# Patient Record
Sex: Female | Born: 1995 | Race: Black or African American | Hispanic: No | Marital: Single | Smoking: Current every day smoker
Health system: Southern US, Community
[De-identification: ages and names within clinical notes are randomized; demographics above are authoritative.]

## PROBLEM LIST (undated history)

## (undated) DIAGNOSIS — N83202 Unspecified ovarian cyst, left side: Secondary | ICD-10-CM

## (undated) DIAGNOSIS — J45909 Unspecified asthma, uncomplicated: Secondary | ICD-10-CM

## (undated) DIAGNOSIS — R55 Syncope and collapse: Secondary | ICD-10-CM

## (undated) DIAGNOSIS — F410 Panic disorder [episodic paroxysmal anxiety] without agoraphobia: Secondary | ICD-10-CM

## (undated) DIAGNOSIS — N83201 Unspecified ovarian cyst, right side: Secondary | ICD-10-CM

## (undated) DIAGNOSIS — F419 Anxiety disorder, unspecified: Secondary | ICD-10-CM

---

## 2013-09-06 ENCOUNTER — Emergency Department (HOSPITAL_COMMUNITY)
Admission: EM | Admit: 2013-09-06 | Discharge: 2013-09-06 | Disposition: A | Discharge status: Home / Self Care | Payer: Self-pay | Attending: Emergency Medicine | Admitting: Emergency Medicine

## 2013-09-06 ENCOUNTER — Emergency Department (HOSPITAL_COMMUNITY): Payer: Self-pay

## 2013-09-06 ENCOUNTER — Encounter (HOSPITAL_COMMUNITY): Payer: Self-pay | Admitting: Emergency Medicine

## 2013-09-06 DIAGNOSIS — R1084 Generalized abdominal pain: Secondary | ICD-10-CM | POA: Insufficient documentation

## 2013-09-06 DIAGNOSIS — J45909 Unspecified asthma, uncomplicated: Secondary | ICD-10-CM | POA: Insufficient documentation

## 2013-09-06 DIAGNOSIS — R109 Unspecified abdominal pain: Secondary | ICD-10-CM

## 2013-09-06 DIAGNOSIS — F17200 Nicotine dependence, unspecified, uncomplicated: Secondary | ICD-10-CM | POA: Insufficient documentation

## 2013-09-06 DIAGNOSIS — N831 Corpus luteum cyst of ovary, unspecified side: Secondary | ICD-10-CM | POA: Insufficient documentation

## 2013-09-06 DIAGNOSIS — Z3202 Encounter for pregnancy test, result negative: Secondary | ICD-10-CM | POA: Insufficient documentation

## 2013-09-06 DIAGNOSIS — R1110 Vomiting, unspecified: Secondary | ICD-10-CM | POA: Insufficient documentation

## 2013-09-06 HISTORY — DX: Unspecified asthma, uncomplicated: J45.909

## 2013-09-06 LAB — CBC WITH DIFFERENTIAL/PLATELET
Basophils Absolute: 0 10*3/uL (ref 0.0–0.1)
HCT: 37.7 % (ref 36.0–46.0)
Lymphocytes Relative: 45 % (ref 12–46)
Neutro Abs: 2.2 10*3/uL (ref 1.7–7.7)
Platelets: 249 10*3/uL (ref 150–400)
RBC: 4.74 MIL/uL (ref 3.87–5.11)
RDW: 13.9 % (ref 11.5–15.5)
WBC: 4.8 10*3/uL (ref 4.0–10.5)

## 2013-09-06 LAB — COMPREHENSIVE METABOLIC PANEL
ALT: 8 U/L (ref 0–35)
AST: 17 U/L (ref 0–37)
Alkaline Phosphatase: 87 U/L (ref 39–117)
BUN: 13 mg/dL (ref 6–23)
CO2: 25 mEq/L (ref 19–32)
Chloride: 101 mEq/L (ref 96–112)
GFR calc Af Amer: >90 mL/min (ref >90)
GFR calc non Af Amer: >90 mL/min (ref >90)
Sodium: 138 mEq/L (ref 135–145)
Total Bilirubin: 0.5 mg/dL (ref 0.3–1.2)

## 2013-09-06 LAB — URINALYSIS, ROUTINE W REFLEX MICROSCOPIC
Bilirubin Urine: NEGATIVE
Glucose, UA: NEGATIVE mg/dL
Hgb urine dipstick: NEGATIVE
Ketones, ur: NEGATIVE mg/dL
Protein, ur: NEGATIVE mg/dL
Urobilinogen, UA: 0.2 mg/dL (ref 0.0–1.0)

## 2013-09-06 LAB — LIPASE, BLOOD: Lipase: 28 U/L (ref 11–59)

## 2013-09-06 LAB — PREGNANCY, URINE: Preg Test, Ur: NEGATIVE

## 2013-09-06 MED ORDER — ONDANSETRON HCL 4 MG/2ML IJ SOLN
4.0000 mg | Freq: Once | INTRAMUSCULAR | Status: AC
  Administered 2013-09-06: 4 mg via INTRAVENOUS
  Filled 2013-09-06: qty 2

## 2013-09-06 MED ORDER — SODIUM CHLORIDE 0.9 % IV BOLUS (SEPSIS)
1000.0000 mL | Freq: Once | INTRAVENOUS | Status: AC
  Administered 2013-09-06: 1000 mL via INTRAVENOUS

## 2013-09-06 MED ORDER — MORPHINE SULFATE 4 MG/ML IJ SOLN
4.0000 mg | Freq: Once | INTRAMUSCULAR | Status: AC
  Administered 2013-09-06: 4 mg via INTRAVENOUS
  Filled 2013-09-06: qty 1

## 2013-09-06 MED ORDER — PROMETHAZINE HCL 25 MG PO TABS: 25.0000 mg | ORAL_TABLET | Freq: Three times a day (TID) | ORAL | Status: DC | PRN

## 2013-09-06 MED ORDER — HYDROCODONE-ACETAMINOPHEN 5-325 MG PO TABS: 1.0000 | ORAL_TABLET | Freq: Four times a day (QID) | ORAL | Status: DC | PRN

## 2013-09-06 MED ORDER — IOHEXOL 300 MG/ML  SOLN
50.0000 mL | Freq: Once | INTRAMUSCULAR | Status: AC | PRN
  Administered 2013-09-06: 50 mL via ORAL

## 2013-09-06 MED ORDER — IOHEXOL 300 MG/ML  SOLN
100.0000 mL | Freq: Once | INTRAMUSCULAR | Status: AC | PRN
  Administered 2013-09-06: 100 mL via ORAL

## 2013-09-06 NOTE — ED Provider Notes (Signed)
Medical screening examination/treatment/procedure(s) were performed by non-physician practitioner and as supervising physician I was immediately available for consultation/collaboration.  Kinta Martis L Tayquan Gassman, MD 09/06/13 2326 

## 2013-09-06 NOTE — ED Provider Notes (Signed)
CSN: [id number]     Arrival date & time 09/06/13  1735 History   First MD Initiated Contact with Patient 09/06/13 2013     Chief Complaint  Patient presents with  . Abdominal Pain    x 2 weeks  . Emesis    x 1 week   (Consider location/radiation/quality/duration/timing/severity/associated sxs/prior Treatment) HPI Patient presents emergency department with 2 weeks worth of generalized abdominal pain, and then a one-week history of vomiting.  Patient, states, that these have been intermittent.  Patient denies vaginal bleeding, or vaginal discharge.  She also denies any difficulty urinating.  Patient denies chest pain, weakness, numbness, dizziness, headache, blurred vision, back pain, fever, or syncope.  Patient, states she did not take any medications prior to arrival. Past Medical History  Diagnosis Date  . Asthma    History reviewed. No pertinent past surgical history. No family history on file. History  Substance Use Topics  . Smoking status: Current Every Day Smoker  . Smokeless tobacco: Not on file  . Alcohol Use: Yes   OB History   Grav Para Term Preterm Abortions TAB SAB Ect Mult Living                 Review of Systems All other systems negative except as documented in the HPI. All pertinent positives and negatives as reviewed in the HPI. Allergies  Review of patient's allergies indicates no known allergies.  Home Medications  No current outpatient prescriptions on file. BP 138/80  Pulse 75  Temp(Src) 98.3 F (36.8 C) (Oral)  Resp 16  SpO2 100%  LMP 08/17/2013 Physical Exam  Nursing note and vitals reviewed. Constitutional: She is oriented to person, place, and time. She appears well-developed and well-nourished. No distress.  HENT:  Head: Normocephalic and atraumatic.  Mouth/Throat: Oropharynx is clear and moist.  Eyes: Pupils are equal, round, and reactive to light.  Neck: Normal range of motion. Neck supple.  Cardiovascular: Normal rate, regular rhythm  and normal heart sounds.  Exam reveals no gallop and no friction rub.   No murmur heard. Pulmonary/Chest: Effort normal and breath sounds normal. No respiratory distress.  Abdominal: Soft. Bowel sounds are normal. She exhibits no distension. There is tenderness. There is no rebound and no guarding.    Neurological: She is alert and oriented to person, place, and time. She exhibits normal muscle tone. Coordination normal.  Skin: Skin is warm and dry. No rash noted.    ED Course  Procedures (including critical care time) Labs Review Labs Reviewed  CBC WITH DIFFERENTIAL - Abnormal; Notable for the following:    MCH 25.7 (*)    All other components within normal limits  COMPREHENSIVE METABOLIC PANEL - Abnormal; Notable for the following:    Glucose, Bld 68 (*)    All other components within normal limits  LIPASE, BLOOD  URINALYSIS, ROUTINE W REFLEX MICROSCOPIC  PREGNANCY, URINE   Imaging Review Ct Abdomen Pelvis W Contrast  09/06/2013   CLINICAL DATA:  Diffuse abdominal pain for 2 weeks, now with nausea and vomiting.  EXAM: CT ABDOMEN AND PELVIS WITH CONTRAST  TECHNIQUE: Multidetector CT imaging of the abdomen and pelvis was performed using the standard protocol following bolus administration of intravenous contrast.  CONTRAST:  OMNIPAQUE IOHEXOL 300 MG/ML  SOLN  COMPARISON:  None available for comparison at time of study interpretation.  FINDINGS: Included view of the lung bases are clear. Visualized heart and pericardium are unremarkable.  The liver, spleen, gallbladder, pancreas and adrenal glands  are unremarkable.  The stomach, small and large bowel are normal in course and caliber without inflammatory changes. Contrast has yet to reach the large bowel. Mild amount of retained large bowel stool. Normal appendix. No intraperitoneal free fluid nor free air.  Kidneys are orthotopic, demonstrating symmetric enhancement without nephrolithiasis, hydronephrosis or renal masses. The  unopacified ureters are normal in course and caliber. Delayed imaging through the kidneys demonstrates symmetric prompt excretion to the proximal urinary collecting system. Urinary bladder is partially distended and unremarkable.  Great vessels are normal in course and caliber. No lymphadenopathy by CT size criteria. Crenulated right adnexal 15 mm presumed corpus luteal cyst. At least 14 mm right uterine body low-density presumed leiomyoma, with approximately 12 mm apparent leiomyoma along the anterior uterine wall. The soft tissues and included osseous structures are nonsuspicious.  IMPRESSION: Apparent 15 mm right corpus luteal cyst in addition to fluid density probable leiomyomas which would be better characterized on pelvic sonogram, as clinically indicated.  Normal appendix.  Mild amount of retained large bowel stool.   Electronically Signed   By: Awilda Metro   On: 09/06/2013 22:30   Patient's laboratory testing, and CT scans were reviewed.  The patient will be referred to GYN.  Followup.  The patient has been sitting in the room in no acute distress.  Her vital signs have been normal here in the emergency department the patient denies any further pain after receiving IV pain medication.  Patient's pain is mostly mid upper abdominal pain.  Patient tolerated oral fluids here without difficulty.  Carlyle Dolly, PA-C 09/06/13 2306

## 2013-09-06 NOTE — ED Notes (Signed)
Pt aware of the need for a urine sample. 

## 2013-09-06 NOTE — ED Notes (Signed)
Pt return from CT.

## 2013-09-06 NOTE — ED Notes (Signed)
Pt presents with NAD- stomach pain generalized pain x 2 weeks and vomiting x 1 week denies vaginall discharge and urinary problems

## 2013-10-29 ENCOUNTER — Emergency Department (HOSPITAL_COMMUNITY): Payer: Managed Care, Other (non HMO)

## 2013-10-29 ENCOUNTER — Emergency Department (HOSPITAL_COMMUNITY)
Admission: EM | Admit: 2013-10-29 | Discharge: 2013-10-29 | Disposition: A | Discharge status: Home / Self Care | Payer: Managed Care, Other (non HMO) | Attending: Emergency Medicine | Admitting: Emergency Medicine

## 2013-10-29 ENCOUNTER — Encounter (HOSPITAL_COMMUNITY): Payer: Self-pay | Admitting: Emergency Medicine

## 2013-10-29 DIAGNOSIS — Z3202 Encounter for pregnancy test, result negative: Secondary | ICD-10-CM | POA: Insufficient documentation

## 2013-10-29 DIAGNOSIS — F17200 Nicotine dependence, unspecified, uncomplicated: Secondary | ICD-10-CM | POA: Insufficient documentation

## 2013-10-29 DIAGNOSIS — N739 Female pelvic inflammatory disease, unspecified: Secondary | ICD-10-CM

## 2013-10-29 DIAGNOSIS — R55 Syncope and collapse: Secondary | ICD-10-CM

## 2013-10-29 DIAGNOSIS — J45909 Unspecified asthma, uncomplicated: Secondary | ICD-10-CM | POA: Insufficient documentation

## 2013-10-29 DIAGNOSIS — R51 Headache: Secondary | ICD-10-CM | POA: Insufficient documentation

## 2013-10-29 HISTORY — DX: Unspecified ovarian cyst, left side: N83.202

## 2013-10-29 HISTORY — DX: Unspecified ovarian cyst, right side: N83.201

## 2013-10-29 LAB — COMPREHENSIVE METABOLIC PANEL
ALK PHOS: 75 U/L (ref 39–117)
ALT: 8 U/L (ref 0–35)
AST: 16 U/L (ref 0–37)
Albumin: 4.1 g/dL (ref 3.5–5.2)
BILIRUBIN TOTAL: 0.6 mg/dL (ref 0.3–1.2)
BUN: 10 mg/dL (ref 6–23)
CHLORIDE: 102 meq/L (ref 96–112)
CO2: 24 mEq/L (ref 19–32)
CREATININE: 0.91 mg/dL (ref 0.50–1.10)
Calcium: 9 mg/dL (ref 8.4–10.5)
GLUCOSE: 86 mg/dL (ref 70–99)
POTASSIUM: 4.1 meq/L (ref 3.7–5.3)
Sodium: 135 mEq/L — ABNORMAL LOW (ref 137–147)
Total Protein: 6.9 g/dL (ref 6.0–8.3)

## 2013-10-29 LAB — URINALYSIS, ROUTINE W REFLEX MICROSCOPIC
Bilirubin Urine: NEGATIVE
Glucose, UA: NEGATIVE mg/dL
Hgb urine dipstick: NEGATIVE
KETONES UR: NEGATIVE mg/dL
NITRITE: NEGATIVE
Protein, ur: NEGATIVE mg/dL
Specific Gravity, Urine: 1.009 (ref 1.005–1.030)
Urobilinogen, UA: 0.2 mg/dL (ref 0.0–1.0)
pH: 7 (ref 5.0–8.0)

## 2013-10-29 LAB — URINE MICROSCOPIC-ADD ON

## 2013-10-29 LAB — CBC
HEMATOCRIT: 36.2 % (ref 36.0–46.0)
Hemoglobin: 11.8 g/dL — ABNORMAL LOW (ref 12.0–15.0)
MCH: 25.4 pg — ABNORMAL LOW (ref 26.0–34.0)
MCHC: 32.6 g/dL (ref 30.0–36.0)
MCV: 77.8 fL — AB (ref 78.0–100.0)
Platelets: 239 10*3/uL (ref 150–400)
RBC: 4.65 MIL/uL (ref 3.87–5.11)
RDW: 13.8 % (ref 11.5–15.5)
WBC: 4.2 10*3/uL (ref 4.0–10.5)

## 2013-10-29 LAB — LIPASE, BLOOD: LIPASE: 42 U/L (ref 11–59)

## 2013-10-29 LAB — WET PREP, GENITAL
Clue Cells Wet Prep HPF POC: NONE SEEN
Trich, Wet Prep: NONE SEEN
Yeast Wet Prep HPF POC: NONE SEEN

## 2013-10-29 LAB — PREGNANCY, URINE: PREG TEST UR: NEGATIVE

## 2013-10-29 MED ORDER — LIDOCAINE HCL 1 % IJ SOLN
INTRAMUSCULAR | Status: AC
  Administered 2013-10-29: 20 mL via INTRAMUSCULAR
  Filled 2013-10-29: qty 20

## 2013-10-29 MED ORDER — SODIUM CHLORIDE 0.9 % IV BOLUS (SEPSIS)
1000.0000 mL | Freq: Once | INTRAVENOUS | Status: AC
  Administered 2013-10-29: 1000 mL via INTRAVENOUS

## 2013-10-29 MED ORDER — DOXYCYCLINE HYCLATE 100 MG PO TABS
100.0000 mg | ORAL_TABLET | Freq: Once | ORAL | Status: AC
  Administered 2013-10-29: 100 mg via ORAL
  Filled 2013-10-29: qty 1

## 2013-10-29 MED ORDER — ONDANSETRON HCL 4 MG/2ML IJ SOLN
4.0000 mg | Freq: Once | INTRAMUSCULAR | Status: AC
  Administered 2013-10-29: 4 mg via INTRAVENOUS
  Filled 2013-10-29: qty 2

## 2013-10-29 MED ORDER — DOXYCYCLINE HYCLATE 100 MG PO CAPS: 100.0000 mg | ORAL_CAPSULE | Freq: Two times a day (BID) | ORAL | Status: DC

## 2013-10-29 MED ORDER — CEFTRIAXONE SODIUM 250 MG IJ SOLR
250.0000 mg | Freq: Once | INTRAMUSCULAR | Status: AC
  Administered 2013-10-29: 250 mg via INTRAMUSCULAR
  Filled 2013-10-29: qty 250

## 2013-10-29 NOTE — ED Notes (Signed)
Initial contact - pt arrived to ED via EMS, reports "not eating well, not feeling well, vomiting, i think i might be pregnant".  Pt reports syncopal episode this AM, witnessed by boyfriend who is currently present.  On arrival to ED pt is a+ox4, well appearing.  Skin pwd.  Pt denies cp/sob, dizziness, weakness.  MAEI.  Pt denies nausea at this time, no active vomiting.  MM pink/moist.  Pt changed to hospital gown, placed to cardiac/02 monitor, NAD.  Awaiting EDP eval.

## 2013-10-29 NOTE — ED Notes (Signed)
Pt ret from U/S at this time, resting on stretcher, denies needs/complaints at this time.  NAD.  

## 2013-10-29 NOTE — ED Notes (Signed)
Bed: WA22 Expected date:  Expected time:  Means of arrival:  Comments: EMS-syncope 

## 2013-10-29 NOTE — ED Provider Notes (Signed)
CSN: [id number]     Arrival date & time 10/29/13  1259 History   First MD Initiated Contact with Patient 10/29/13 1317     Chief Complaint  Patient presents with  . Loss of Consciousness  . Emesis   (Consider location/radiation/quality/duration/timing/severity/associated sxs/prior Treatment) HPI Comments: 18 year old female presents with syncope. She states she was walking from one class to another and felt acutely dizzy and nauseous and passed out. She's not following. She's not think she injured herself. The female that is in the room states that he saw her on the ground but did not actually witness her fall. She denies any chest pain, shortness of breath, or acute headache. She states for the past week she has been nauseous and only to eat soups and fruits. She is nauseous when eating greasy foods or other foods. She has not vomited. She's not had any diarrhea. She has been having intermittent abdominal pain over the past week as well. She points to her left lower quadrant as the area of worst pain. She was here over a month ago with pain on the other side and told she had an ovarian cyst seen on CT scan. She denies any vaginal bleeding or discharge. Her last period was 3 weeks ago.   Past Medical History  Diagnosis Date  . Asthma   . Bilateral ovarian cysts    History reviewed. No pertinent past surgical history. No family history on file. History  Substance Use Topics  . Smoking status: Current Every Day Smoker  . Smokeless tobacco: Not on file  . Alcohol Use: 0.6 oz/week    1 Glasses of wine per week   OB History   Grav Para Term Preterm Abortions TAB SAB Ect Mult Living                 Review of Systems  Constitutional: Negative for fever.  Respiratory: Negative for shortness of breath.   Cardiovascular: Negative for chest pain.  Gastrointestinal: Positive for nausea and abdominal pain. Negative for vomiting and diarrhea.  Genitourinary: Negative for dysuria, hematuria,  vaginal bleeding and vaginal discharge.  Musculoskeletal: Negative for back pain.  Neurological: Positive for syncope and headaches (mild headache for past 3 days. Milder than her normal migraines). Negative for weakness and numbness.  All other systems reviewed and are negative.    Allergies  Review of patient's allergies indicates no known allergies.  Home Medications  No current outpatient prescriptions on file. BP 107/89  Pulse 92  Temp(Src) 98.5 F (36.9 C)  Resp 20  SpO2 100%  LMP 10/12/2013 Physical Exam  Nursing note and vitals reviewed. Constitutional: She is oriented to person, place, and time. She appears well-developed and well-nourished. No distress.  HENT:  Head: Normocephalic and atraumatic.  Right Ear: External ear normal.  Left Ear: External ear normal.  Nose: Nose normal.  Eyes: Right eye exhibits no discharge. Left eye exhibits no discharge.  Cardiovascular: Normal rate, regular rhythm and normal heart sounds.   Pulmonary/Chest: Effort normal and breath sounds normal.  Abdominal: Soft. Normal appearance. There is generalized tenderness (worst in lower abdomen).  Genitourinary: Uterus is tender. Cervix exhibits discharge. Right adnexum displays no mass. Left adnexum displays no mass. Vaginal discharge found.  Neurological: She is alert and oriented to person, place, and time.  Skin: Skin is warm and dry.    ED Course  Procedures (including critical care time) Labs Review Labs Reviewed  WET PREP, GENITAL - Abnormal; Notable for the following:  WBC, Wet Prep HPF POC FEW (*)    All other components within normal limits  URINALYSIS, ROUTINE W REFLEX MICROSCOPIC - Abnormal; Notable for the following:    APPearance CLOUDY (*)    Leukocytes, UA TRACE (*)    All other components within normal limits  URINE MICROSCOPIC-ADD ON - Abnormal; Notable for the following:    Squamous Epithelial / LPF FEW (*)    All other components within normal limits  CBC -  Abnormal; Notable for the following:    Hemoglobin 11.8 (*)    MCV 77.8 (*)    MCH 25.4 (*)    All other components within normal limits  COMPREHENSIVE METABOLIC PANEL - Abnormal; Notable for the following:    Sodium 135 (*)    All other components within normal limits  GC/CHLAMYDIA PROBE AMP  PREGNANCY, URINE  LIPASE, BLOOD   Imaging Review Dg Chest 2 View  10/29/2013   CLINICAL DATA:  Syncope  EXAM: CHEST  2 VIEW  COMPARISON:  None.  FINDINGS: Lungs are clear. Heart size and pulmonary vascularity are normal. No adenopathy. There is thoracic dextroscoliosis. And mediastinal contours are within normal limits. Both lungs are clear. The visualized skeletal structures are unremarkable.  IMPRESSION: No edema or consolidation.   Electronically Signed   By: Bretta Bang M.D.   On: 10/29/2013 14:58    EKG Interpretation    Date/Time:  Thursday October 29 2013 14:24:00 EST Ventricular Rate:  68 PR Interval:  110 QRS Duration: 79 QT Interval:  385 QTC Calculation: 409 R Axis:   89 Text Interpretation:  Sinus rhythm Borderline short PR interval Borderline Q waves in lateral leads No old tracing to compare Confirmed by Moneisha Vosler  MD, Miette Molenda ([id number]) on 10/29/2013 2:30:25 PM            MDM   1. Syncope   2. PID (pelvic inflammatory disease)    Patient is well appearing here. EKG shows no acute reason for syncope, no murmur or chest sx. Grossly normal neuro exam. Given her lower abd pain and previous CT findings (large cyst) with vaginal discharge, will get pelvic ultrasound. Will treat presumptively for PID given her pain and nausea. Possibly the cause of her syncope. Given rocephin IM here, will need 2 weeks of doxy. Care transferred with u/s pending, if negative I feel she can be discharged.    Audree Camel, MD 10/29/13 [phone]

## 2013-10-29 NOTE — ED Provider Notes (Signed)
Dr. Criss AlvineGoldston asked me to check her ultrasound results, then discharge if no abnormal findings were discovered.   Dg Chest 2 View  10/29/2013   CLINICAL DATA:  Syncope  EXAM: CHEST  2 VIEW  COMPARISON:  None.  FINDINGS: Lungs are clear. Heart size and pulmonary vascularity are normal. No adenopathy. There is thoracic dextroscoliosis. And mediastinal contours are within normal limits. Both lungs are clear. The visualized skeletal structures are unremarkable.  IMPRESSION: No edema or consolidation.   Electronically Signed   By: Bretta BangWilliam  Woodruff M.D.   On: 10/29/2013 14:58   [place] Pelvis Complete  10/29/2013   CLINICAL DATA:  18 year old female with pelvic pain maximal at the left lower quadrant. Initial encounter. Negative pregnancy test.  EXAM: TRANSABDOMINAL AND TRANSVAGINAL ULTRASOUND OF PELVIS  DOPPLER ULTRASOUND OF OVARIES  TECHNIQUE: Both transabdominal and transvaginal ultrasound examinations of the pelvis were performed. Transabdominal technique was performed for global imaging of the pelvis including uterus, ovaries, adnexal regions, and pelvic cul-de-sac.  It was necessary to proceed with endovaginal exam following the transabdominal exam to visualize the endometrium. Color and duplex Doppler ultrasound was utilized to evaluate blood flow to the ovaries.  COMPARISON:  CT Abdomen and Pelvis 09/06/2013.  FINDINGS: Uterus  Measurements: 9.1 x 3.6 x 4.8 cm. No fibroids or other mass visualized.  Endometrium  Thickness: 10 mm.  No focal abnormality visualized.  Right ovary  Measurements: 2.7 x 2.2 x 2.9 cm. Complex 12 mm area most resembles a collapsing cyst (image 34). No internal vascularity. Otherwise normal small follicles.  Left ovary  Measurements: 2.5 x 1.5 x 1.8 cm. Normal appearance/no adnexal mass.  Pulsed Doppler evaluation of both ovaries demonstrates normal low-resistance arterial and venous waveforms.  Other findings  Small volume pelvic free fluid.  IMPRESSION: Negative, with no evidence of  ovarian torsion. Small volume free fluid and 12 mm complex area in the right ovary most likely are physiologic.   Electronically Signed   By: Augusto GambleLee  Hall M.D.   On: 10/29/2013 15:54      Her ultrasound has returned  17:455- She is calm and comfortable. Vitals normal. Findings and implications of STD discussed with her, including recommendation for partner evaluation/treatment.  Assessment: U/S does not indicate ovarian problems or severe PID/TOA. Based on this she is stable for d/c and OP treatment for PID  Flint MelterElliott L Stellar Gensel, MD 10/29/13 1754

## 2013-10-29 NOTE — ED Notes (Signed)
Pt from home with "i think i might be pregnant", pt reports vomiting, feeling unwell and syncope this AM.  Pt awake, alert, oriented x4.

## 2013-10-29 NOTE — ED Notes (Signed)
Pt to cxr/u/s at this time.

## 2013-10-30 LAB — GC/CHLAMYDIA PROBE AMP
CT PROBE, AMP APTIMA: NEGATIVE
GC Probe RNA: NEGATIVE

## 2013-11-11 ENCOUNTER — Emergency Department (HOSPITAL_COMMUNITY): Payer: Managed Care, Other (non HMO)

## 2013-11-11 ENCOUNTER — Emergency Department (HOSPITAL_COMMUNITY)
Admission: EM | Admit: 2013-11-11 | Discharge: 2013-11-11 | Disposition: A | Discharge status: Home / Self Care | Payer: Managed Care, Other (non HMO) | Attending: Emergency Medicine | Admitting: Emergency Medicine

## 2013-11-11 ENCOUNTER — Encounter (HOSPITAL_COMMUNITY): Payer: Self-pay | Admitting: Emergency Medicine

## 2013-11-11 DIAGNOSIS — J45909 Unspecified asthma, uncomplicated: Secondary | ICD-10-CM | POA: Insufficient documentation

## 2013-11-11 DIAGNOSIS — N390 Urinary tract infection, site not specified: Secondary | ICD-10-CM | POA: Insufficient documentation

## 2013-11-11 DIAGNOSIS — R42 Dizziness and giddiness: Secondary | ICD-10-CM | POA: Insufficient documentation

## 2013-11-11 DIAGNOSIS — I951 Orthostatic hypotension: Secondary | ICD-10-CM | POA: Insufficient documentation

## 2013-11-11 DIAGNOSIS — R0602 Shortness of breath: Secondary | ICD-10-CM | POA: Insufficient documentation

## 2013-11-11 DIAGNOSIS — Z3202 Encounter for pregnancy test, result negative: Secondary | ICD-10-CM | POA: Insufficient documentation

## 2013-11-11 DIAGNOSIS — R112 Nausea with vomiting, unspecified: Secondary | ICD-10-CM | POA: Insufficient documentation

## 2013-11-11 DIAGNOSIS — F17200 Nicotine dependence, unspecified, uncomplicated: Secondary | ICD-10-CM | POA: Insufficient documentation

## 2013-11-11 DIAGNOSIS — Z792 Long term (current) use of antibiotics: Secondary | ICD-10-CM | POA: Insufficient documentation

## 2013-11-11 DIAGNOSIS — R55 Syncope and collapse: Secondary | ICD-10-CM | POA: Insufficient documentation

## 2013-11-11 LAB — POCT I-STAT, CHEM 8
BUN: 7 mg/dL (ref 6–23)
CHLORIDE: 103 meq/L (ref 96–112)
CREATININE: 0.9 mg/dL (ref 0.50–1.10)
Calcium, Ion: 1.2 mmol/L (ref 1.12–1.23)
GLUCOSE: 87 mg/dL (ref 70–99)
HCT: 40 % (ref 36.0–46.0)
Hemoglobin: 13.6 g/dL (ref 12.0–15.0)
POTASSIUM: 3.6 meq/L — AB (ref 3.7–5.3)
Sodium: 141 mEq/L (ref 137–147)
TCO2: 24 mmol/L (ref 0–100)

## 2013-11-11 LAB — CBC WITH DIFFERENTIAL/PLATELET
BASOS ABS: 0 10*3/uL (ref 0.0–0.1)
BASOS PCT: 1 % (ref 0–1)
EOS ABS: 0 10*3/uL (ref 0.0–0.7)
EOS PCT: 1 % (ref 0–5)
HEMATOCRIT: 37.1 % (ref 36.0–46.0)
HEMOGLOBIN: 12.1 g/dL (ref 12.0–15.0)
Lymphocytes Relative: 41 % (ref 12–46)
Lymphs Abs: 1.6 10*3/uL (ref 0.7–4.0)
MCH: 25.7 pg — AB (ref 26.0–34.0)
MCHC: 32.6 g/dL (ref 30.0–36.0)
MCV: 78.9 fL (ref 78.0–100.0)
MONO ABS: 0.4 10*3/uL (ref 0.1–1.0)
MONOS PCT: 9 % (ref 3–12)
Neutro Abs: 1.8 10*3/uL (ref 1.7–7.7)
Neutrophils Relative %: 48 % (ref 43–77)
Platelets: 233 10*3/uL (ref 150–400)
RBC: 4.7 MIL/uL (ref 3.87–5.11)
RDW: 14 % (ref 11.5–15.5)
WBC: 3.8 10*3/uL — ABNORMAL LOW (ref 4.0–10.5)

## 2013-11-11 LAB — BASIC METABOLIC PANEL
BUN: 9 mg/dL (ref 6–23)
CALCIUM: 9.6 mg/dL (ref 8.4–10.5)
CO2: 24 mEq/L (ref 19–32)
CREATININE: 0.86 mg/dL (ref 0.50–1.10)
Chloride: 101 mEq/L (ref 96–112)
Glucose, Bld: 88 mg/dL (ref 70–99)
Potassium: 3.8 mEq/L (ref 3.7–5.3)
Sodium: 138 mEq/L (ref 137–147)

## 2013-11-11 LAB — POCT PREGNANCY, URINE: Preg Test, Ur: NEGATIVE

## 2013-11-11 LAB — URINALYSIS, ROUTINE W REFLEX MICROSCOPIC
Bilirubin Urine: NEGATIVE
Glucose, UA: NEGATIVE mg/dL
Hgb urine dipstick: NEGATIVE
KETONES UR: NEGATIVE mg/dL
NITRITE: NEGATIVE
PROTEIN: NEGATIVE mg/dL
Specific Gravity, Urine: 1.002 — ABNORMAL LOW (ref 1.005–1.030)
Urobilinogen, UA: 0.2 mg/dL (ref 0.0–1.0)
pH: 7.5 (ref 5.0–8.0)

## 2013-11-11 LAB — PREGNANCY, URINE: Preg Test, Ur: NEGATIVE

## 2013-11-11 LAB — URINE MICROSCOPIC-ADD ON

## 2013-11-11 MED ORDER — SODIUM CHLORIDE 0.9 % IV BOLUS (SEPSIS)
1000.0000 mL | Freq: Once | INTRAVENOUS | Status: AC
  Administered 2013-11-11: 1000 mL via INTRAVENOUS

## 2013-11-11 MED ORDER — ONDANSETRON 4 MG PO TBDP: 4.0000 mg | ORAL_TABLET | Freq: Three times a day (TID) | ORAL | Status: DC | PRN

## 2013-11-11 MED ORDER — SULFAMETHOXAZOLE-TRIMETHOPRIM 800-160 MG PO TABS: 1.0000 | ORAL_TABLET | Freq: Two times a day (BID) | ORAL | Status: DC

## 2013-11-11 NOTE — ED Notes (Signed)
Pt had witnessed syncopal episode by friends, stated it lasted a couple of secs. Pt states she has been sick since Dec, n/v, thinks she maybe pregnant. Denies pain at the moment.

## 2013-11-11 NOTE — ED Provider Notes (Signed)
CSN: [id number]     Arrival date & time 11/11/13  1421 History   First MD Initiated Contact with Patient 11/11/13 1512     Chief Complaint  Patient presents with  . Loss of Consciousness     (Consider location/radiation/quality/duration/timing/severity/associated sxs/prior Treatment) HPI Comments: Patient is a 18 year old female with a past medical history of ovarian cysts and asthma who presents after a witnessed syncopal episode that occurred prior to arrival. Patient reports feeling suddenly lightheaded and SOB and then she "passed out." A friend witnessed the episode. Patient was unconscious for a few seconds before becoming conscious again. Patient reports possibly being pregnant. She also reports feeling nauseous and having some vomiting for the past 2 months. Patient reports not drinking that much water lately. No aggravating/alleviating factors. Patient had an episode similar to this about 2 weeks ago and was diagnosed with PID.    Past Medical History  Diagnosis Date  . Asthma   . Bilateral ovarian cysts    History reviewed. No pertinent past surgical history. No family history on file. History  Substance Use Topics  . Smoking status: Current Every Day Smoker  . Smokeless tobacco: Not on file  . Alcohol Use: 0.6 oz/week    1 Glasses of wine per week   OB History   Grav Para Term Preterm Abortions TAB SAB Ect Mult Living                 Review of Systems  Constitutional: Negative for fever, chills and fatigue.  HENT: Negative for trouble swallowing.   Eyes: Negative for visual disturbance.  Respiratory: Negative for shortness of breath.   Cardiovascular: Negative for chest pain and palpitations.  Gastrointestinal: Positive for nausea. Negative for vomiting, abdominal pain and diarrhea.  Genitourinary: Negative for dysuria and difficulty urinating.  Musculoskeletal: Negative for arthralgias and neck pain.  Skin: Negative for color change.  Neurological: Positive for  syncope. Negative for dizziness and weakness.  Psychiatric/Behavioral: Negative for dysphoric mood.      Allergies  Review of patient's allergies indicates no known allergies.  Home Medications   Current Outpatient Rx  Name  Route  Sig  Dispense  Refill  . doxycycline (VIBRAMYCIN) 100 MG capsule   Oral   Take 1 capsule (100 mg total) by mouth 2 (two) times daily. One po bid x 14 days   28 capsule   0    BP 125/72  Pulse 101  Temp(Src) 98.7 F (37.1 C) (Oral)  Resp 16  SpO2 100%  LMP 10/12/2013 Physical Exam  Nursing note and vitals reviewed. Constitutional: She is oriented to person, place, and time. She appears well-developed and well-nourished. No distress.  HENT:  Head: Normocephalic and atraumatic.  Eyes: Conjunctivae and EOM are normal.  Neck: Normal range of motion.  Cardiovascular: Normal rate and regular rhythm.  Exam reveals no gallop and no friction rub.   No murmur heard. Pulmonary/Chest: Effort normal and breath sounds normal. She has no wheezes. She has no rales. She exhibits no tenderness.  Abdominal: Soft. She exhibits no distension. There is no tenderness. There is no rebound.  Musculoskeletal: Normal range of motion.  Neurological: She is alert and oriented to person, place, and time. Coordination normal.  Speech is goal-oriented. Moves limbs without ataxia.   Skin: Skin is warm and dry.  Psychiatric: She has a normal mood and affect. Her behavior is normal.    ED Course  Procedures (including critical care time) Labs Review Labs Reviewed  CBC WITH DIFFERENTIAL - Abnormal; Notable for the following:    WBC 3.8 (*)    MCH 25.7 (*)    All other components within normal limits  URINALYSIS, ROUTINE W REFLEX MICROSCOPIC - Abnormal; Notable for the following:    Specific Gravity, Urine 1.002 (*)    Leukocytes, UA TRACE (*)    All other components within normal limits  POCT I-STAT, CHEM 8 - Abnormal; Notable for the following:    Potassium 3.6 (*)     All other components within normal limits  BASIC METABOLIC PANEL  PREGNANCY, URINE  URINE MICROSCOPIC-ADD ON  POCT PREGNANCY, URINE   Imaging Review Dg Chest 2 View  11/11/2013   CLINICAL DATA:  Shortness of breath and syncope.  EXAM: CHEST - 2 VIEW  COMPARISON:  DG CHEST 2 VIEW dated 10/29/2013  FINDINGS: The heart size and mediastinal contours are within normal limits. There is no evidence of pulmonary edema, consolidation, pneumothorax, nodule or pleural fluid. There is a stable mild rightward convex scoliosis of the mid thoracic spine.  IMPRESSION: No active disease.   Electronically Signed   By: Irish Lack M.D.   On: 11/11/2013 15:55    EKG Interpretation    Date/Time:  Wednesday November 11 2013 14:55:09 EST Ventricular Rate:  69 PR Interval:  109 QRS Duration: 77 QT Interval:  392 QTC Calculation: 420 R Axis:   90 Text Interpretation:  Sinus rhythm Short PR interval Borderline right axis deviation Sinus rhythm No significant change since last tracing with short PR Abnormal ekg Confirmed by Gerhard Munch  MD [phone]) on 11/11/2013 3:14:14 PM            MDM   Final diagnoses:  Syncope due to orthostatic hypotension  UTI (lower urinary tract infection)    4:17 PM Patient is PERC negative. Patient's labs are unremarkable for acute changes. Chest xray unremarkable for acute changes. Urinalysis shows mild infection. Patient's orthostatic vitals signs show decreased in 12 mmhg on diastolic pressure when standing and patient becomes tachycardic at 101 when standing. Patient likely volume depleted from vomiting/not drinking enough water which caused her syncopal episode. Patient given 1 L fluids here.     Emilia Beck, PA-C 11/11/13 1712

## 2013-11-11 NOTE — Discharge Instructions (Signed)
Take bactrim as directed until gone. Take zofran as needed for nausea. Refer to attached documents for more information. Follow up with a primary care provider from the resource guide below.    Emergency Department Resource Guide 1) Find a Doctor and Pay Out of Pocket Although you won't have to find out who is covered by your insurance plan, it is a good idea to ask around and get recommendations. You will then need to call the office and see if the doctor you have chosen will accept you as a new patient and what types of options they offer for patients who are self-pay. Some doctors offer discounts or will set up payment plans for their patients who do not have insurance, but you will need to ask so you aren't surprised when you get to your appointment.  2) Contact Your Local Health Department Not all health departments have doctors that can see patients for sick visits, but many do, so it is worth a call to see if yours does. If you don't know where your local health department is, you can check in your phone book. The CDC also has a tool to help you locate your state's health department, and many state websites also have listings of all of their local health departments.  3) Find a Walk-in Clinic If your illness is not likely to be very severe or complicated, you may want to try a walk in clinic. These are popping up all over the country in pharmacies, drugstores, and shopping centers. They're usually staffed by nurse practitioners or physician assistants that have been trained to treat common illnesses and complaints. They're usually fairly quick and inexpensive. However, if you have serious medical issues or chronic medical problems, these are probably not your best option.  No Primary Care Doctor: - Call Health Connect at  [phone] - they can help you locate a primary care doctor that  accepts your insurance, provides certain services, etc. - Physician Referral Service- [phone]  Chronic  Pain Problems: Organization         Address  Phone   Notes  [facility]  [phone] Patients need to be referred by their primary care doctor.   Medication Assistance: Organization         Address  Phone   Notes  [facility] [address] [place], [place] [address] [phone] --Must be a resident of [facility] -- Must have NO insurance coverage whatsoever (no Medicaid/ Medicare, etc.) -- The pt. MUST have a primary care doctor that directs their care regularly and follows them in the community   Med[facility]ist  [phone]   [employer]  [phone]    Agencies that provide inexpensive medical care: Organization         Address  Phone   Notes  [facility]  [phone]   [facility]    [phone]   [facility] [address] [place], [place] [address] [phone]   [facility] [address], [place] [phone]   [facility]anned Parenthood    [phone]   [facility]    [phone]   [facility]  [address], [place] Phone:  [phone], Fax:  [phone]) [phone] Hours of Operation:  9 am - 6 pm, M-F.  Also accepts Medicaid/Medicare and self-pay.  [facility]  [address], [place] Phone: [phone], Fax: [phone]. Hours of Operation:  8:30 am - 5:30 pm, M-F.  Also accepts Medicaid and self-pay.  °[facility] [place] [address], [place] Phone: [phone]   °[facility] [address], [place], [place] [phone], Ext. [phone] Mondays & Thursdays: 7-9 AM.  First 15 patients are seen on a first come, first serve basis. °  ° °Medicaid-accepting [place] Providers: ° °Organization         Address  Phone   Notes  °[facility] [address], [place] [phone] Also  accepts self-pay patients.  °[facility] [address], [place] ° [phone]   °[facility] [address], [place] [phone]   °[facility] [address] [place] [address], [place] [phone]   °Veita Bland [address], [place]  ° [phone] Only accepts [place] Access Medicaid patients after they have their name applied to their card.  ° °Self-Pay (no insurance) in [place]: ° °Organization         Address  Phone   Notes  °Sickle Cell Patients, [facility] [address], [place] [phone]   °[facility] [address], [place] [phone]   °[facility] [place] ° [address], [place] [phone]   °[facility]/Dr. Osei-Bonsu ° [address] [place] [address], [place] or [address], [place] [phone] Phone number for both [place] and [place] locations is the same.  °[facility] [address], [place] [phone]   °[facility] [address] [place] [address], [place] or [address] [phone] °([phone]) [phone]   °[facility] [address], [place] [phone], phone; [phone], fax Sees patients 1st and 3rd Saturday of every month.  Must not qualify for public or private insurance (i.e. Medicaid, Medicare, [place] Health Choice, Veterans' Benefits) • Household income should be no more than 200% of the poverty level •The clinic cannot treat you if you are pregnant or think you are pregnant • Sexually transmitted diseases are not treated at the clinic.  ° ° °Dental Care: °Organization         Address  Phone  Notes  °[place] [facility] [address], [place] [phone] Accepts children up to age 21 who are enrolled in Medicaid or [place] Health Choice; pregnant  women with a Medicaid card; and children who have applied for Medicaid or [place] Health Choice, but were declined, whose parents can pay a reduced fee at time of service.  °[place] [facility] [place]  [address], [place] [phone]) [phone] Accepts children up to age 21 who are enrolled in Medicaid or [place] Health Choice; pregnant women with a Medicaid card; and children who have applied for Medicaid or  Health Choice, but were declined, whose parents can pay a reduced fee at time of service.  °[facility] ° [address],  [phone] Patients are seen by appointment only. Walk-ins are not accepted. [facility] will see patients 18 years of age and older. °Monday - Tuesday (8am-5pm) °Most Wednesdays (8:30-5pm) °$30 per visit, cash only  °[facility]  [address], [facility] [phone] Patients are seen by appointment only. Walk-ins are not accepted. [place] will see patients 60 years of age and older. One Wednesday Evening (Monthly: Volunteer Based).  $30 per visit, cash only  [place]  [phone] for adults; Children under age 4, call Graduate Pediatric Dentistry at [phone]. Children aged 49-14, please call [phone] to request a pediatric application.  Dental services are provided in all areas of dental care including fillings, crowns and bridges, complete and partial dentures, implants, gum treatment, root canals, and extractions. Preventive care is also provided. Treatment is provided to both adults and children. Patients are selected via a lottery and there is often a waiting list.   [facility] [address] [place]  [phone] [email or web address]   [facility] [address] [place], [place] [phone], Ext. [phone] Second and Fourth Thursday of each month, opens at 6:30 AM; Clinic ends at 9 AM.  Patients are  seen on a first-come first-served basis, and a limited number are seen during each clinic.   [facility]  [address] Hillard Danker [place], [place] [phone]   Eligibility Requirements You must have lived in [place], [place], or [place] counties for at least the last three months.   You cannot be eligible for state or federal sponsored [employer], including [employer], [place], or [employer].   You generally cannot be eligible for healthcare insurance through your employer.    How to apply: Eligibility screenings are held every Tuesday and Wednesday afternoon from 1:00 pm until 4:00 pm. You do not need an appointment for the interview!  [facility] [address] [place], [place]   [place] Department  [place]  [phone]    Behavioral Health Resources in the Community: Intensive Outpatient Programs Organization         Address  Phone  Notes  [facility] [place]. [address], [place], [place] [phone]   [facility] [address], [place], [place]   [facility]: [facility] [address] [address], [place], [place]   [place] [address],  [place], [place] or [phone]   Substance Abuse Resources Organization         Address  Phone  Notes  [facility]  [phone]   [place]  [phone]   [facility] [place]   Chinita Pester  [phone]   Residential & Outpatient Substance Abuse Program  [phone]   Psychological Services Organization         Address  Phone  Notes  [facility] [place]  [place]  [phone]   [place] [address] [place] [phone] or [phone]    Mobile Crisis  Teams Organization         Address  Phone  Notes  [facility], Mobile Crisis Care Unit  [phone]   [facility]  [address] [place], [place]   [facility] [address] [place] [phone]    Self-Help/Support Groups Organization         Address  Phone             Notes  [facility] [place] - variety of support groups  [place] Call for more information  Narcotics Anonymous (NA), [facility] [address], [employer] [place]  2 meetings at this location   Special educational needs teacher         Address  Phone  Notes  [facility] [place],    [place]  [phone]   [facility]  [address], [place] [address], [place], [place]   [place], [place] [phone] Admissions: 8am-3pm M-F  Incentives Substance [place] [address],    [place], [place] [phone]   [facility] [address] [place], [place], [place]   [facility] [address]  [place], [place]   [facility] - Intensive Outpatient [address], [place], [place]   [facility]  [place], [place] [phone] or [phone]   [facility] ([facility]) [address] [place], [place] Accepts Medicaid  [facility] [place] [address],  [place] [phone] Substance Abuse/Addiction Treatment   [facility] Organization         Address  Phone  Notes  [facility]  [phone]   Domenic Schwab, PhD [address] Arlis Porta [place], [place]   [phone] or [phone]   [place], [place] [phone]   [facility] [address], [place], [place] [phone]  Insurance/Medicaid/sponsorship through [facility] [address] [place] [address]                                    [place], [place] [phone] [place] [address], [place] [phone]    Dr. Adele Schilder  [phone]   [facility] Dept. 1) [address] [place] 2) [place] 3)  [address] [address], [place] [phone]  [phone]   [place] [phone] or [phone] (After Hours)

## 2013-11-11 NOTE — ED Provider Notes (Signed)
Medical screening examination/treatment/procedure(s) were performed by non-physician practitioner and as supervising physician I was immediately available for consultation/collaboration.  Flint MelterElliott L Rakan Soffer, MD 11/11/13 2232

## 2013-11-11 NOTE — ED Notes (Signed)
Bed: WLPT1 Expected date:  Expected time:  Means of arrival:  Comments: 18 y/o syncope

## 2014-02-12 IMAGING — CT CT ABD-PELV W/ CM
1 of 2 series · 15 of 32 positions shown, 19 images · IV contrast (omnipaque)
Comparison: None available for comparison at time of study
interpretation.

CLINICAL DATA: Diffuse abdominal pain for 2 weeks, now with nausea
and vomiting.

EXAM:
CT ABDOMEN AND PELVIS WITH CONTRAST
TECHNIQUE: Multidetector CT imaging of the abdomen and pelvis was performed
using the standard protocol following bolus administration of
intravenous contrast.
CONTRAST:  100mL OMNIPAQUE IOHEXOL 300 MG/ML  SOLN

[Series 2: abd/pel with · axial · 0.62mm/px · z∈[+1166,+1542]mm · 15 of 83 slices shown, 19 images]
[im 4/83  soft-tissue]
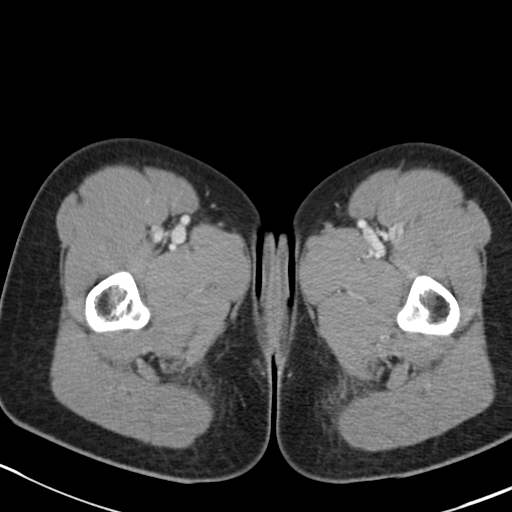
[im 4/83  bone]
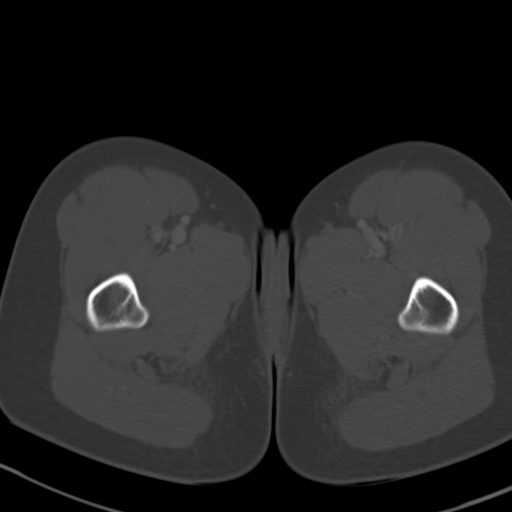
[im 11/83  soft-tissue]
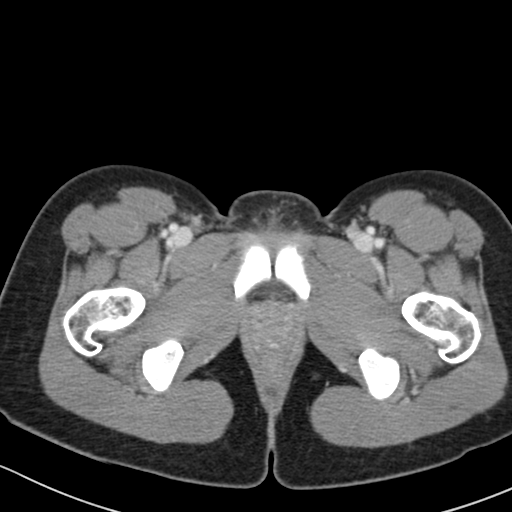
[im 18/83  soft-tissue]
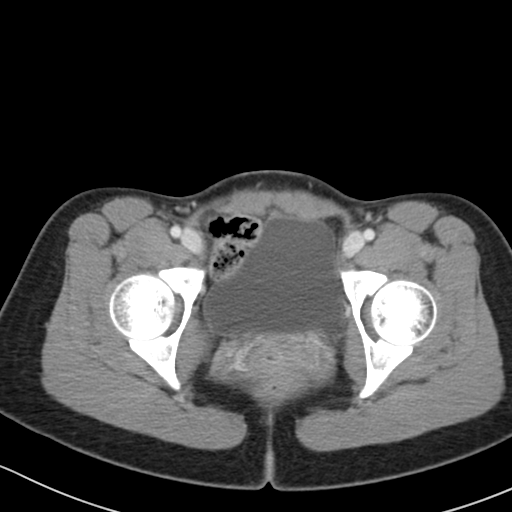
[im 24/83  soft-tissue]
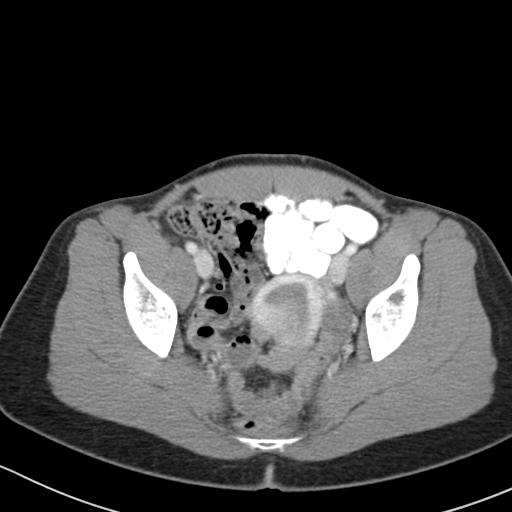
[im 28/83  soft-tissue]
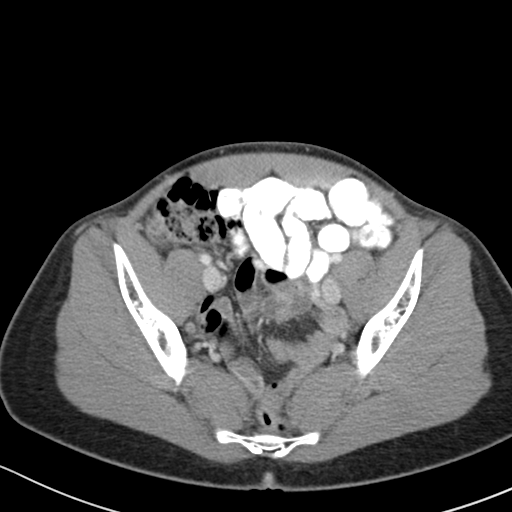
[im 35/83  soft-tissue]
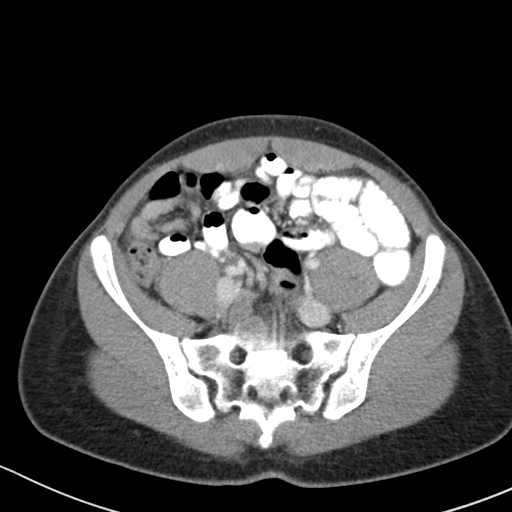
[im 42/83  soft-tissue]
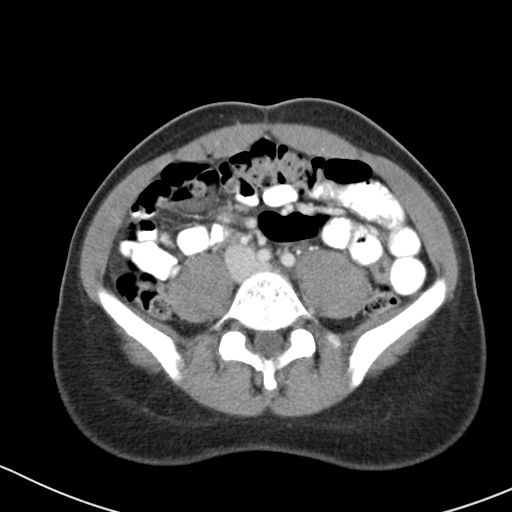
[im 48/83  soft-tissue]
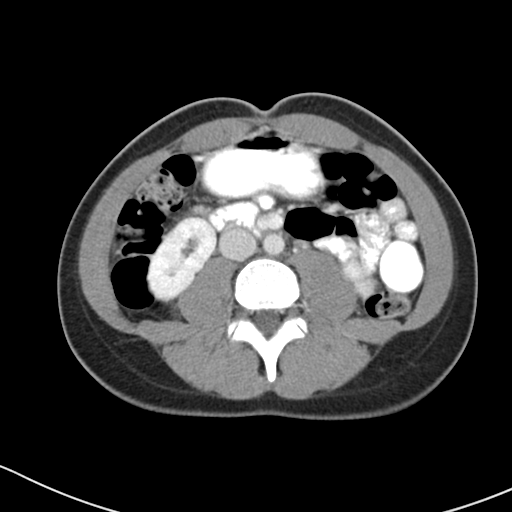
[im 55/83  soft-tissue]
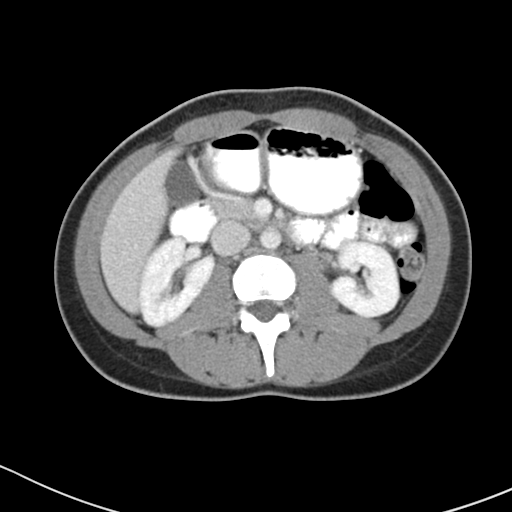
[im 55/83  bone]
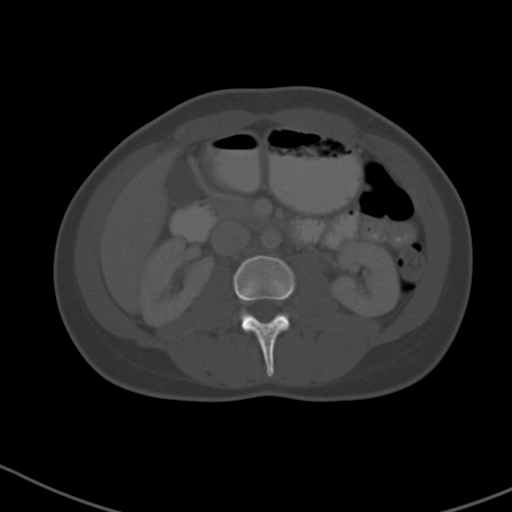
[im 59/83  soft-tissue]
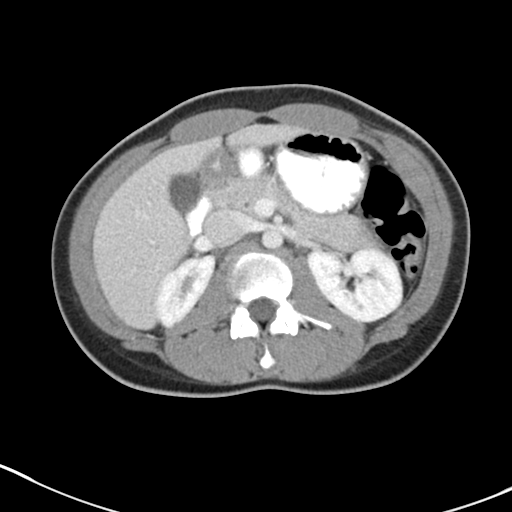
[im 65/83  soft-tissue]
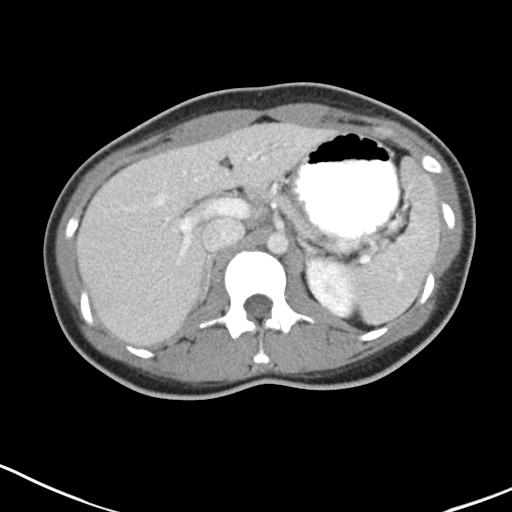
[im 69/83  lung]
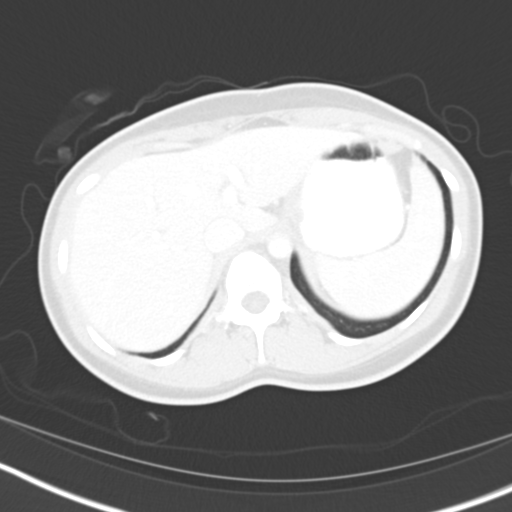
[im 72/83  soft-tissue]
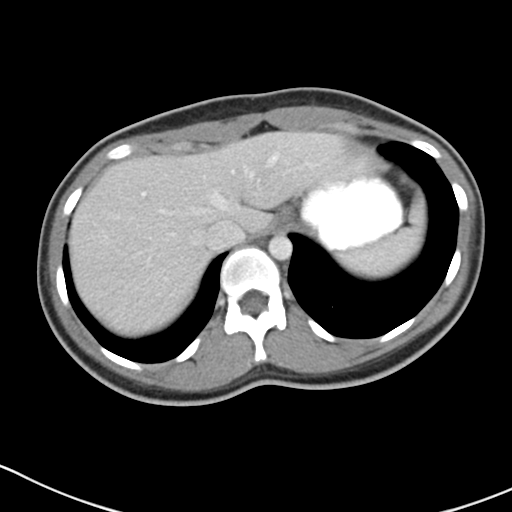
[im 72/83  lung]
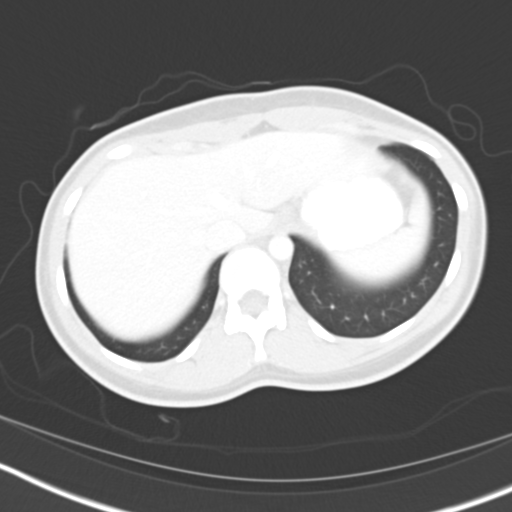
[im 76/83  lung]
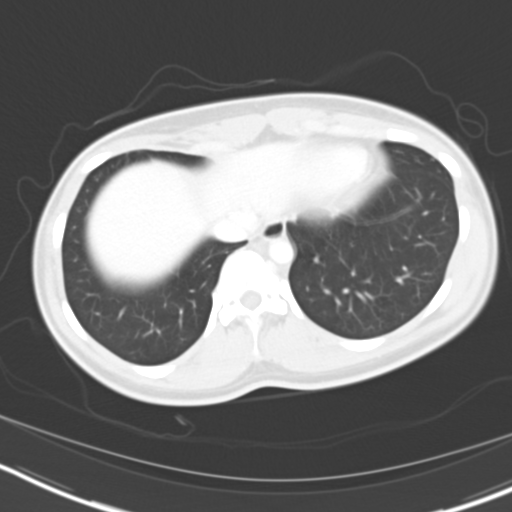
[im 79/83  soft-tissue]
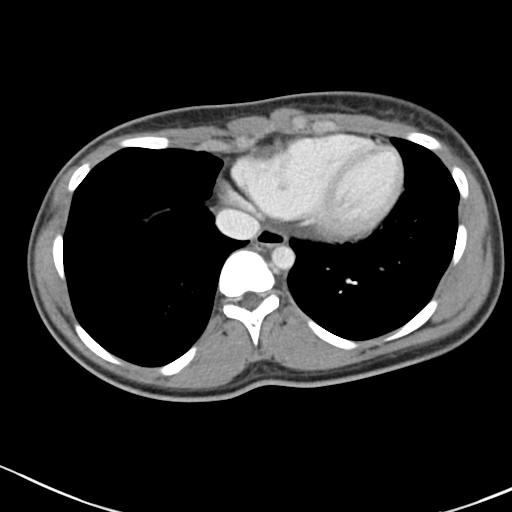
[im 79/83  lung]
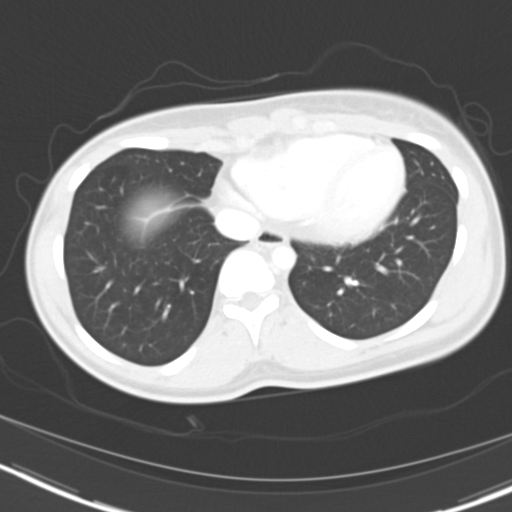

[15 of 32 positions shown; findings below may reference images not displayed]

FINDINGS: Included view of the lung bases are clear. Visualized heart and
pericardium are unremarkable.

The liver, spleen, gallbladder, pancreas and adrenal glands are
unremarkable.

The stomach, small and large bowel are normal in course and caliber
without inflammatory changes. Contrast has yet to reach the large
bowel. Mild amount of retained large bowel stool. Normal appendix.
No intraperitoneal free fluid nor free air.

Kidneys are orthotopic, demonstrating symmetric enhancement without
nephrolithiasis, hydronephrosis or renal masses. The unopacified
ureters are normal in course and caliber. Delayed imaging through
the kidneys demonstrates symmetric prompt excretion to the proximal
urinary collecting system. Urinary bladder is partially distended
and unremarkable.

Great vessels are normal in course and caliber. No lymphadenopathy
by CT size criteria. Crenulated right adnexal 15 mm presumed corpus
luteal cyst. At least 14 mm right uterine body low-density presumed
leiomyoma, with approximately 12 mm apparent leiomyoma along the
anterior uterine wall. The soft tissues and included osseous
structures are nonsuspicious.
IMPRESSION: Apparent 15 mm right corpus luteal cyst in addition to fluid density
probable leiomyomas which would be better characterized on pelvic
sonogram, as clinically indicated.

Normal appendix.  Mild amount of retained large bowel stool.

  By: Tanu Oxendine

## 2014-07-16 ENCOUNTER — Encounter (HOSPITAL_COMMUNITY): Payer: Self-pay | Admitting: Emergency Medicine

## 2014-07-16 ENCOUNTER — Emergency Department (HOSPITAL_COMMUNITY): Payer: Managed Care, Other (non HMO)

## 2014-07-16 ENCOUNTER — Emergency Department (HOSPITAL_COMMUNITY)
Admission: EM | Admit: 2014-07-16 | Discharge: 2014-07-16 | Disposition: A | Discharge status: Home / Self Care | Payer: Managed Care, Other (non HMO) | Attending: Emergency Medicine | Admitting: Emergency Medicine

## 2014-07-16 DIAGNOSIS — Z8742 Personal history of other diseases of the female genital tract: Secondary | ICD-10-CM | POA: Diagnosis not present

## 2014-07-16 DIAGNOSIS — Z79899 Other long term (current) drug therapy: Secondary | ICD-10-CM | POA: Insufficient documentation

## 2014-07-16 DIAGNOSIS — R1031 Right lower quadrant pain: Secondary | ICD-10-CM | POA: Diagnosis not present

## 2014-07-16 DIAGNOSIS — J45909 Unspecified asthma, uncomplicated: Secondary | ICD-10-CM | POA: Insufficient documentation

## 2014-07-16 DIAGNOSIS — Z3202 Encounter for pregnancy test, result negative: Secondary | ICD-10-CM | POA: Insufficient documentation

## 2014-07-16 DIAGNOSIS — K5900 Constipation, unspecified: Secondary | ICD-10-CM | POA: Insufficient documentation

## 2014-07-16 DIAGNOSIS — Z720 Tobacco use: Secondary | ICD-10-CM | POA: Insufficient documentation

## 2014-07-16 DIAGNOSIS — R1032 Left lower quadrant pain: Secondary | ICD-10-CM | POA: Diagnosis not present

## 2014-07-16 DIAGNOSIS — F410 Panic disorder [episodic paroxysmal anxiety] without agoraphobia: Secondary | ICD-10-CM | POA: Diagnosis not present

## 2014-07-16 DIAGNOSIS — R109 Unspecified abdominal pain: Secondary | ICD-10-CM

## 2014-07-16 HISTORY — DX: Anxiety disorder, unspecified: F41.9

## 2014-07-16 HISTORY — DX: Syncope and collapse: R55

## 2014-07-16 HISTORY — DX: Panic disorder (episodic paroxysmal anxiety): F41.0

## 2014-07-16 LAB — COMPREHENSIVE METABOLIC PANEL
ALT: 8 U/L (ref 0–35)
ANION GAP: 14 (ref 5–15)
AST: 18 U/L (ref 0–37)
Albumin: 4.4 g/dL (ref 3.5–5.2)
Alkaline Phosphatase: 68 U/L (ref 39–117)
BILIRUBIN TOTAL: 0.5 mg/dL (ref 0.3–1.2)
BUN: 16 mg/dL (ref 6–23)
CALCIUM: 9.5 mg/dL (ref 8.4–10.5)
CHLORIDE: 102 meq/L (ref 96–112)
CO2: 23 meq/L (ref 19–32)
Creatinine, Ser: 0.91 mg/dL (ref 0.50–1.10)
GFR calc non Af Amer: >90 mL/min (ref >90)
Glucose, Bld: 93 mg/dL (ref 70–99)
Potassium: 3.8 mEq/L (ref 3.7–5.3)
Sodium: 139 mEq/L (ref 137–147)
Total Protein: 7.5 g/dL (ref 6.0–8.3)

## 2014-07-16 LAB — CBC WITH DIFFERENTIAL/PLATELET
Basophils Absolute: 0 10*3/uL (ref 0.0–0.1)
Basophils Relative: 0 % (ref 0–1)
Eosinophils Absolute: 0.1 10*3/uL (ref 0.0–0.7)
Eosinophils Relative: 1 % (ref 0–5)
HEMATOCRIT: 36.1 % (ref 36.0–46.0)
HEMOGLOBIN: 11.8 g/dL — AB (ref 12.0–15.0)
LYMPHS PCT: 48 % — AB (ref 12–46)
Lymphs Abs: 3.5 10*3/uL (ref 0.7–4.0)
MCH: 25.7 pg — ABNORMAL LOW (ref 26.0–34.0)
MCHC: 32.7 g/dL (ref 30.0–36.0)
MCV: 78.6 fL (ref 78.0–100.0)
MONOS PCT: 9 % (ref 3–12)
Monocytes Absolute: 0.7 10*3/uL (ref 0.1–1.0)
NEUTROS ABS: 3 10*3/uL (ref 1.7–7.7)
Neutrophils Relative %: 42 % — ABNORMAL LOW (ref 43–77)
Platelets: 252 10*3/uL (ref 150–400)
RBC: 4.59 MIL/uL (ref 3.87–5.11)
RDW: 13.2 % (ref 11.5–15.5)
WBC: 7.3 10*3/uL (ref 4.0–10.5)

## 2014-07-16 LAB — URINALYSIS, ROUTINE W REFLEX MICROSCOPIC
Bilirubin Urine: NEGATIVE
Glucose, UA: NEGATIVE mg/dL
Hgb urine dipstick: NEGATIVE
KETONES UR: 40 mg/dL — AB
Leukocytes, UA: NEGATIVE
NITRITE: NEGATIVE
Protein, ur: NEGATIVE mg/dL
Specific Gravity, Urine: 1.026 (ref 1.005–1.030)
UROBILINOGEN UA: 0.2 mg/dL (ref 0.0–1.0)
pH: 5.5 (ref 5.0–8.0)

## 2014-07-16 LAB — WET PREP, GENITAL
Clue Cells Wet Prep HPF POC: NONE SEEN
TRICH WET PREP: NONE SEEN
Yeast Wet Prep HPF POC: NONE SEEN

## 2014-07-16 LAB — LIPASE, BLOOD: Lipase: 49 U/L (ref 11–59)

## 2014-07-16 LAB — POC URINE PREG, ED: PREG TEST UR: NEGATIVE

## 2014-07-16 MED ORDER — TRAMADOL HCL 50 MG PO TABS: 50.0000 mg | ORAL_TABLET | Freq: Four times a day (QID) | ORAL | Status: AC | PRN

## 2014-07-16 MED ORDER — KETOROLAC TROMETHAMINE 30 MG/ML IJ SOLN
30.0000 mg | Freq: Once | INTRAMUSCULAR | Status: AC
  Administered 2014-07-16: 30 mg via INTRAVENOUS
  Filled 2014-07-16: qty 1

## 2014-07-16 MED ORDER — SODIUM CHLORIDE 0.9 % IV BOLUS (SEPSIS)
1000.0000 mL | Freq: Once | INTRAVENOUS | Status: AC
  Administered 2014-07-16: 1000 mL via INTRAVENOUS

## 2014-07-16 NOTE — Discharge Instructions (Signed)
Abdominal Pain, Women °Abdominal (stomach, pelvic, or belly) pain can be caused by many things. It is important to tell your doctor: °· The location of the pain. °· Does it come and go or is it present all the time? °· Are there things that start the pain (eating certain foods, exercise)? °· Are there other symptoms associated with the pain (fever, nausea, vomiting, diarrhea)? °All of this is helpful to know when trying to find the cause of the pain. °CAUSES  °· Stomach: virus or bacteria infection, or ulcer. °· Intestine: appendicitis (inflamed appendix), regional ileitis (Crohn's disease), ulcerative colitis (inflamed colon), irritable bowel syndrome, diverticulitis (inflamed diverticulum of the colon), or cancer of the stomach or intestine. °· Gallbladder disease or stones in the gallbladder. °· Kidney disease, kidney stones, or infection. °· Pancreas infection or cancer. °· Fibromyalgia (pain disorder). °· Diseases of the female organs: °· Uterus: fibroid (non-cancerous) tumors or infection. °· Fallopian tubes: infection or tubal pregnancy. °· Ovary: cysts or tumors. °· Pelvic adhesions (scar tissue). °· Endometriosis (uterus lining tissue growing in the pelvis and on the pelvic organs). °· Pelvic congestion syndrome (female organs filling up with blood just before the menstrual period). °· Pain with the menstrual period. °· Pain with ovulation (producing an egg). °· Pain with an IUD (intrauterine device, birth control) in the uterus. °· Cancer of the female organs. °· Functional pain (pain not caused by a disease, may improve without treatment). °· Psychological pain. °· Depression. °DIAGNOSIS  °Your doctor will decide the seriousness of your pain by doing an examination. °· Blood tests. °· X-rays. °· Ultrasound. °· CT scan (computed tomography, special type of X-ray). °· MRI (magnetic resonance imaging). °· Cultures, for infection. °· Barium enema (dye inserted in the large intestine, to better view it with  X-rays). °· Colonoscopy (looking in intestine with a lighted tube). °· Laparoscopy (minor surgery, looking in abdomen with a lighted tube). °· Major abdominal exploratory surgery (looking in abdomen with a large incision). °TREATMENT  °The treatment will depend on the cause of the pain.  °· Many cases can be observed and treated at home. °· Over-the-counter medicines recommended by your caregiver. °· Prescription medicine. °· Antibiotics, for infection. °· Birth control pills, for painful periods or for ovulation pain. °· Hormone treatment, for endometriosis. °· Nerve blocking injections. °· Physical therapy. °· Antidepressants. °· Counseling with a psychologist or psychiatrist. °· Minor or major surgery. °HOME CARE INSTRUCTIONS  °· Do not take laxatives, unless directed by your caregiver. °· Take over-the-counter pain medicine only if ordered by your caregiver. Do not take aspirin because it can cause an upset stomach or bleeding. °· Try a clear liquid diet (broth or water) as ordered by your caregiver. Slowly move to a bland diet, as tolerated, if the pain is related to the stomach or intestine. °· Have a thermometer and take your temperature several times a day, and record it. °· Bed rest and sleep, if it helps the pain. °· Avoid sexual intercourse, if it causes pain. °· Avoid stressful situations. °· Keep your follow-up appointments and tests, as your caregiver orders. °· If the pain does not go away with medicine or surgery, you may try: °· Acupuncture. °· Relaxation exercises (yoga, meditation). °· Group therapy. °· Counseling. °SEEK MEDICAL CARE IF:  °· You notice certain foods cause stomach pain. °· Your home care treatment is not helping your pain. °· You need stronger pain medicine. °· You want your IUD removed. °· You feel faint or   lightheaded. °· You develop nausea and vomiting. °· You develop a rash. °· You are having side effects or an allergy to your medicine. °SEEK IMMEDIATE MEDICAL CARE IF:  °· Your  pain does not go away or gets worse. °· You have a fever. °· Your pain is felt only in portions of the abdomen. The right side could possibly be appendicitis. The left lower portion of the abdomen could be colitis or diverticulitis. °· You are passing blood in your stools (bright red or black tarry stools, with or without vomiting). °· You have blood in your urine. °· You develop chills, with or without a fever. °· You pass out. °MAKE SURE YOU:  °· Understand these instructions. °· Will watch your condition. °· Will get help right away if you are not doing well or get worse. °Document Released: 07/08/2007 Document Revised: 01/25/2014 Document Reviewed: 07/28/2009 °Exit[employer]® Patient Information ©2015 [employer]. This information is not intended to replace advice given to you by your health care provider. Make sure you discuss any questions you have with your health care provider. ° °Panic Attacks °Panic attacks are sudden, short-lived surges of severe anxiety, fear, or discomfort. They may occur for no reason when you are relaxed, when you are anxious, or when you are sleeping. Panic attacks may occur for a number of reasons:  °· Healthy people occasionally have panic attacks in extreme, life-threatening situations, such as war or natural disasters. Normal anxiety is a protective mechanism of the body that helps us react to danger (fight or flight response). °· Panic attacks are often seen with anxiety disorders, such as panic disorder, social anxiety disorder, generalized anxiety disorder, and phobias. Anxiety disorders cause excessive or uncontrollable anxiety. They may interfere with your relationships or other life activities. °· Panic attacks are sometimes seen with other mental illnesses, such as depression and posttraumatic stress disorder. °· Certain medical conditions, prescription medicines, and drugs of abuse can cause panic attacks. °SYMPTOMS  °Panic attacks start suddenly, peak within 20 minutes, and  are accompanied by four or more of the following symptoms: °· Pounding heart or fast heart rate (palpitations). °· Sweating. °· Trembling or shaking. °· Shortness of breath or feeling smothered. °· Feeling choked. °· Chest pain or discomfort. °· Nausea or strange feeling in your stomach. °· Dizziness, light-headedness, or feeling like you will faint. °· Chills or hot flushes. °· Numbness or tingling in your lips or hands and feet. °· Feeling that things are not real or feeling that you are not yourself. °· Fear of losing control or going crazy. °· Fear of dying. °Some of these symptoms can mimic serious medical conditions. For example, you may think you are having a heart attack. Although panic attacks can be very scary, they are not life threatening. °DIAGNOSIS  °Panic attacks are diagnosed through an assessment by your health care provider. Your health care provider will ask questions about your symptoms, such as where and when they occurred. Your health care provider will also ask about your medical history and use of alcohol and drugs, including prescription medicines. Your health care provider may order blood tests or other studies to rule out a serious medical condition. Your health care provider may refer you to a mental health professional for further evaluation. °TREATMENT  °· Most healthy people who have one or two panic attacks in an extreme, life-threatening situation will not require treatment. °· The treatment for panic attacks associated with anxiety disorders or other mental illness typically involves counseling with   a mental health professional, medicine, or a combination of both. Your health care provider will help determine what treatment is best for you. °· Panic attacks due to physical illness usually go away with treatment of the illness. If prescription medicine is causing panic attacks, talk with your health care provider about stopping the medicine, decreasing the dose, or substituting  another medicine. °· Panic attacks due to alcohol or drug abuse go away with abstinence. Some adults need professional help in order to stop drinking or using drugs. °HOME CARE INSTRUCTIONS  °· Take all medicines as directed by your health care provider.   °· Schedule and attend follow-up visits as directed by your health care provider. It is important to keep all your appointments. °SEEK MEDICAL CARE IF: °· You are not able to take your medicines as prescribed. °· Your symptoms do not improve or get worse. °SEEK IMMEDIATE MEDICAL CARE IF:  °· You experience panic attack symptoms that are different than your usual symptoms. °· You have serious thoughts about hurting yourself or others. °· You are taking medicine for panic attacks and have a serious side effect. °MAKE SURE YOU: °· Understand these instructions. °· Will watch your condition. °· Will get help right away if you are not doing well or get worse. °Document Released: 09/10/2005 Document Revised: 09/15/2013 Document Reviewed: 04/24/2013 °ExitCare® Patient Information ©2015 [employer]. This information is not intended to replace advice given to you by your health care provider. Make sure you discuss any questions you have with your health care provider. ° °

## 2014-07-16 NOTE — ED Provider Notes (Signed)
CSN: [id number]     Arrival date & time 07/16/14  [id number] History   First MD Initiated Contact with Patient 07/16/14 [phone]     Chief Complaint  Patient presents with  . Anxiety  . Abdominal Pain   Patient is a 18 y.o. female presenting with anxiety and abdominal pain.  Anxiety Associated symptoms include abdominal pain and chills. Pertinent negatives include no fatigue, fever, myalgias, nausea or vomiting.  Abdominal Pain Associated symptoms: chills and constipation   Associated symptoms: no diarrhea (Loose stools), no dysuria, no fatigue, no fever, no hematuria, no nausea, no shortness of breath, no vaginal bleeding, no vaginal discharge and no vomiting     Patient is a 18 y.o. Female who presents to the ED with panic attack and bilateral left and right lower quadrant pain.  Patient states that tonight she was on her way home from work late and was walking by herself when she realized that she did not have her keys to her apartment and was unable to get a hold of her roommate which triggered a panic attack.  Patient states that during her panic attack she felt that she was hyperventilating, had some numbness and tingling to the hands, and her peripheral vision was black around the edges.  Patient states that she hit the panic button and EMS came.  Patient is also having 4 weeks of intermittent bilateral lower quadrant pains.  Patient states that the pain is radiating to her back.  Patient states that the pain is better with water and fruit and vegetable intake.  Pain was made worse from the panic attack.  Patient states that she had pain similar to this in February.  Patient never had a workup for this in the past.  Patient has no surgical history on her abdomen, but patient does have a history of bilateral ovarian cysts.  Patient complains of some associated episodes of several days of constipation followed by soft formed stools.  Past Medical History  Diagnosis Date  . Asthma   . Bilateral  ovarian cysts   . Panic attack   . Anxiety   . Syncope    History reviewed. No pertinent past surgical history. Family History  Problem Relation Age of Onset  . Diabetes Other    History  Substance Use Topics  . Smoking status: Current Every Day Smoker    Types: Cigarettes  . Smokeless tobacco: Not on file  . Alcohol Use: No   OB History   Grav Para Term Preterm Abortions TAB SAB Ect Mult Living                 Review of Systems  Constitutional: Positive for chills. Negative for fever and fatigue.  Respiratory: Negative for chest tightness and shortness of breath.   Gastrointestinal: Positive for abdominal pain and constipation. Negative for nausea, vomiting and diarrhea (Loose stools).  Genitourinary: Positive for frequency. Negative for dysuria, urgency, hematuria, vaginal bleeding, vaginal discharge, difficulty urinating, vaginal pain and pelvic pain.  Musculoskeletal: Negative for myalgias.  All other systems reviewed and are negative.     Allergies  Review of patient's allergies indicates no known allergies.  Home Medications   Prior to Admission medications   Medication Sig Start Date End Date Taking? Authorizing Provider  vitamin E 200 UNIT capsule Take 200 Units by mouth daily.   Yes Historical Provider, MD  traMADol (ULTRAM) 50 MG tablet Take 1 tablet (50 mg total) by mouth every 6 (six) hours as needed. 07/16/14  Rylynne Schicker A Forcucci, PA-C   BP 101/56  Pulse 80  Temp(Src) 98.1 F (36.7 C) (Oral)  Resp 16  SpO2 100%  LMP 06/20/2014 Physical Exam  Nursing note and vitals reviewed. Constitutional: She is oriented to person, place, and time. She appears well-developed and well-nourished. No distress.  HENT:  Head: Normocephalic and atraumatic.  Mouth/Throat: Oropharynx is clear and moist. No oropharyngeal exudate.  Eyes: Conjunctivae and EOM are normal. Pupils are equal, round, and reactive to light. No scleral icterus.  Neck: Normal range of motion.  Neck supple. No JVD present. No thyromegaly present.  Cardiovascular: Normal rate, regular rhythm, normal heart sounds and intact distal pulses.  Exam reveals no gallop and no friction rub.   No murmur heard. Pulmonary/Chest: Effort normal and breath sounds normal. No respiratory distress. She has no wheezes. She has no rales. She exhibits no tenderness.  Abdominal: Soft. Normal appearance and bowel sounds are normal. She exhibits no distension and no mass. There is tenderness in the right lower quadrant, suprapubic area and left lower quadrant. There is no rebound, no guarding and no CVA tenderness.  Genitourinary: No labial fusion. There is no rash, tenderness, lesion or injury on the right labia. There is no rash, tenderness, lesion or injury on the left labia. Cervix exhibits no motion tenderness, no discharge and no friability. Right adnexum displays no mass, no tenderness and no fullness. Left adnexum displays no mass, no tenderness and no fullness. No erythema, tenderness or bleeding around the vagina. No foreign body around the vagina. No signs of injury around the vagina. No vaginal discharge found.  Musculoskeletal: Normal range of motion.  Lymphadenopathy:    She has no cervical adenopathy.  Neurological: She is alert and oriented to person, place, and time. She has normal strength. No cranial nerve deficit or sensory deficit. Coordination normal.  Skin: Skin is warm and dry. She is not diaphoretic.  Psychiatric: She has a normal mood and affect. Her behavior is normal. Judgment and thought content normal.    ED Course  Procedures (including critical care time) Labs Review Labs Reviewed  WET PREP, GENITAL - Abnormal; Notable for the following:    WBC, Wet Prep HPF POC FEW (*)    All other components within normal limits  CBC WITH DIFFERENTIAL - Abnormal; Notable for the following:    Hemoglobin 11.8 (*)    MCH 25.7 (*)    Neutrophils Relative % 42 (*)    Lymphocytes Relative 48  (*)    All other components within normal limits  URINALYSIS, ROUTINE W REFLEX MICROSCOPIC - Abnormal; Notable for the following:    Ketones, ur 40 (*)    All other components within normal limits  GC/CHLAMYDIA PROBE AMP  COMPREHENSIVE METABOLIC PANEL  LIPASE, BLOOD  POC URINE PREG, ED    Imaging Review Dg Abd 2 Views  07/16/2014   CLINICAL DATA:  Lower abdominal pain for 2 weeks  EXAM: ABDOMEN - 2 VIEW  COMPARISON:  None.  FINDINGS: Scattered large and small bowel gas is noted. The stomach is partially distended with fluid. No free air is seen. No abnormal mass or abnormal calcifications are noted. No acute bony abnormality is seen.  IMPRESSION: Nonspecific abdomen.   Electronically Signed   By: Alcide CleverMark  Lukens M.D.   On: 07/16/2014 07:24     EKG Interpretation None      MDM   Final diagnoses:  Abdominal pain  Panic attack   Patient is a 18 y.o. Female who  presents to the ED with panic attack and bilateral lower quadrant abdominal pain.  Physical exam reveals calm, non-toxic appearing female with no evidence of abdominal rigidity, guarding, or mass.  CBC reveals mild lymphocytosis.  CMP is unremarkable.  Lipase is negative.  UA is negative.  Urine pregnancy is negative.  GC pending.  There is mild left adnexal tenderness.  There is no CMT tenderness to palpation.  Wet prep shows no evidence of any infection.  Patient is stable at this time.  Will discharge the patient home with tramadol for pain and will have her follow-up with ObGyn for further evaluation.  Differential includes ovarian cysts, IBS.  Doubt appendicitis given length of symptoms.  Patient to return for signs of appendicitis. Patient states understanding and agreement.  Patient was discussed with Dr. Micheline Maze who agrees with the above workup and plan.        Eben Burow, PA-C 07/16/14 1008
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
## 2014-07-16 NOTE — ED Provider Notes (Signed)
Medical screening examination/treatment/procedure(s) were performed by non-physician practitioner and as supervising physician I was immediately available for consultation/collaboration.  Megan Docherty, MD 07/16/14 1705 

## 2014-07-16 NOTE — ED Notes (Signed)
Pt also is c/o generalized abd pain and mild diarrhea earlier today  Denies N/V

## 2014-07-16 NOTE — ED Notes (Signed)
Patient transported to X-ray 

## 2014-07-17 LAB — GC/CHLAMYDIA PROBE AMP
CT PROBE, AMP APTIMA: NEGATIVE
GC Probe RNA: NEGATIVE

## 2014-08-02 ENCOUNTER — Encounter (HOSPITAL_COMMUNITY): Payer: Self-pay | Admitting: *Deleted

## 2014-08-02 ENCOUNTER — Emergency Department (HOSPITAL_COMMUNITY)
Admission: EM | Admit: 2014-08-02 | Discharge: 2014-08-02 | Disposition: A | Discharge status: Home / Self Care | Payer: Managed Care, Other (non HMO) | Attending: Emergency Medicine | Admitting: Emergency Medicine

## 2014-08-02 DIAGNOSIS — J45909 Unspecified asthma, uncomplicated: Secondary | ICD-10-CM | POA: Diagnosis not present

## 2014-08-02 DIAGNOSIS — R110 Nausea: Secondary | ICD-10-CM | POA: Insufficient documentation

## 2014-08-02 DIAGNOSIS — R1030 Lower abdominal pain, unspecified: Secondary | ICD-10-CM

## 2014-08-02 DIAGNOSIS — Z8742 Personal history of other diseases of the female genital tract: Secondary | ICD-10-CM | POA: Insufficient documentation

## 2014-08-02 DIAGNOSIS — R1084 Generalized abdominal pain: Secondary | ICD-10-CM | POA: Diagnosis not present

## 2014-08-02 DIAGNOSIS — Z8659 Personal history of other mental and behavioral disorders: Secondary | ICD-10-CM | POA: Diagnosis not present

## 2014-08-02 DIAGNOSIS — Z79899 Other long term (current) drug therapy: Secondary | ICD-10-CM | POA: Insufficient documentation

## 2014-08-02 DIAGNOSIS — Z720 Tobacco use: Secondary | ICD-10-CM | POA: Diagnosis not present

## 2014-08-02 DIAGNOSIS — R1032 Left lower quadrant pain: Secondary | ICD-10-CM | POA: Diagnosis present

## 2014-08-02 LAB — CBC WITH DIFFERENTIAL/PLATELET
Basophils Absolute: 0 10*3/uL (ref 0.0–0.1)
Basophils Relative: 0 % (ref 0–1)
Eosinophils Absolute: 0 10*3/uL (ref 0.0–0.7)
Eosinophils Relative: 1 % (ref 0–5)
HEMATOCRIT: 36 % (ref 36.0–46.0)
Hemoglobin: 11.9 g/dL — ABNORMAL LOW (ref 12.0–15.0)
LYMPHS ABS: 2 10*3/uL (ref 0.7–4.0)
LYMPHS PCT: 43 % (ref 12–46)
MCH: 26.4 pg (ref 26.0–34.0)
MCHC: 33.1 g/dL (ref 30.0–36.0)
MCV: 79.8 fL (ref 78.0–100.0)
MONO ABS: 0.4 10*3/uL (ref 0.1–1.0)
MONOS PCT: 8 % (ref 3–12)
Neutro Abs: 2.3 10*3/uL (ref 1.7–7.7)
Neutrophils Relative %: 48 % (ref 43–77)
PLATELETS: 230 10*3/uL (ref 150–400)
RBC: 4.51 MIL/uL (ref 3.87–5.11)
RDW: 13.3 % (ref 11.5–15.5)
WBC: 4.6 10*3/uL (ref 4.0–10.5)

## 2014-08-02 LAB — I-STAT CHEM 8, ED
BUN: 14 mg/dL (ref 6–23)
CALCIUM ION: 1.14 mmol/L (ref 1.12–1.23)
Chloride: 107 mEq/L (ref 96–112)
Creatinine, Ser: 0.9 mg/dL (ref 0.50–1.10)
GLUCOSE: 97 mg/dL (ref 70–99)
HCT: 39 % (ref 36.0–46.0)
HEMOGLOBIN: 13.3 g/dL (ref 12.0–15.0)
Potassium: 3.2 mEq/L — ABNORMAL LOW (ref 3.7–5.3)
SODIUM: 141 meq/L (ref 137–147)
TCO2: 20 mmol/L (ref 0–100)

## 2014-08-02 LAB — URINE MICROSCOPIC-ADD ON

## 2014-08-02 LAB — URINALYSIS, ROUTINE W REFLEX MICROSCOPIC
Bilirubin Urine: NEGATIVE
GLUCOSE, UA: NEGATIVE mg/dL
HGB URINE DIPSTICK: NEGATIVE
Ketones, ur: 15 mg/dL — AB
Nitrite: NEGATIVE
Protein, ur: NEGATIVE mg/dL
SPECIFIC GRAVITY, URINE: 1.027 (ref 1.005–1.030)
Urobilinogen, UA: 1 mg/dL (ref 0.0–1.0)
pH: 6 (ref 5.0–8.0)

## 2014-08-02 MED ORDER — SODIUM CHLORIDE 0.9 % IV BOLUS (SEPSIS)
500.0000 mL | Freq: Once | INTRAVENOUS | Status: AC
  Administered 2014-08-02: 500 mL via INTRAVENOUS

## 2014-08-02 MED ORDER — DICYCLOMINE HCL 10 MG/ML IM SOLN
10.0000 mg | Freq: Once | INTRAMUSCULAR | Status: AC
  Administered 2014-08-02: 10 mg via INTRAMUSCULAR
  Filled 2014-08-02: qty 2

## 2014-08-02 MED ORDER — ONDANSETRON 4 MG PO TBDP: 4.0000 mg | ORAL_TABLET | Freq: Three times a day (TID) | ORAL | Status: AC | PRN

## 2014-08-02 MED ORDER — DICYCLOMINE HCL 20 MG PO TABS: 20.0000 mg | ORAL_TABLET | Freq: Two times a day (BID) | ORAL | Status: AC

## 2014-08-02 MED ORDER — ONDANSETRON HCL 4 MG/2ML IJ SOLN
4.0000 mg | Freq: Once | INTRAMUSCULAR | Status: AC
  Administered 2014-08-02: 4 mg via INTRAVENOUS
  Filled 2014-08-02: qty 2

## 2014-08-02 NOTE — Discharge Instructions (Signed)
Nicole Wong given a prescription for Bentyl which is the medications were given in the emergency department if also been referred to gastroenterology for further evaluation of your recurrent chronic abdominal pain as well has  [facility] please make appointment with both further evaluation

## 2014-08-02 NOTE — ED Notes (Signed)
Bed: WA21 Expected date:  Expected time:  Means of arrival:  Comments: EMS  

## 2014-08-02 NOTE — ED Provider Notes (Signed)
CSN: [id number]     Arrival date & time 08/02/14  0127 History   First MD Initiated Contact with Patient 08/02/14 0130     Chief Complaint  Patient presents with  . Abdominal Pain     (Consider location/radiation/quality/duration/timing/severity/associated sxs/prior Treatment) HPI Comments: This is a 18 year old with a history of chronic recurrent lower abdominal pain associated with nausea patient states the pain started approximately 4 hours ago associated with nausea no vomiting occurred after she ate pizza that her previous pain episodes can be related to food she denies any sexual activity since April last menstrual cycle was 2 weeks ago she's had CT scans and ultrasounds diagnose without any definitive new since she states she did not have anything in the house to take for her discomfort so has not taken any over-the-counter medication or any of the prescribed Zofran for nausea  Patient is a 18 y.o. female presenting with abdominal pain. The history is provided by the patient.  Abdominal Pain Pain location:  Suprapubic, LLQ and RLQ Pain quality: cramping   Pain radiates to:  Does not radiate Pain severity:  Severe Onset quality:  Sudden Duration:  4 hours Timing:  Constant Progression:  Waxing and waning Chronicity:  Recurrent Context: not diet changes, not eating, not laxative use, not previous surgeries, not recent illness, not recent sexual activity and not recent travel   Relieved by:  None tried Worsened by:  Nothing tried Ineffective treatments:  None tried Associated symptoms: nausea   Associated symptoms: no diarrhea, no dysuria, no fever, no shortness of breath, no vaginal bleeding, no vaginal discharge and no vomiting   Nausea:    Severity:  Mild   Onset quality:  Sudden   Duration:  4 hours   Timing:  Intermittent   Progression:  Unchanged Risk factors: not obese, not pregnant and no recent hospitalization     Past Medical History  Diagnosis Date  . Asthma    . Bilateral ovarian cysts   . Panic attack   . Anxiety   . Syncope    History reviewed. No pertinent past surgical history. Family History  Problem Relation Age of Onset  . Diabetes Other    History  Substance Use Topics  . Smoking status: Current Every Day Smoker    Types: Cigarettes  . Smokeless tobacco: Not on file  . Alcohol Use: No   OB History    No data available     Review of Systems  Constitutional: Negative for fever.  Respiratory: Negative for shortness of breath.   Gastrointestinal: Positive for nausea and abdominal pain. Negative for vomiting, diarrhea and abdominal distention.  Genitourinary: Negative for dysuria, vaginal bleeding and vaginal discharge.  Skin: Negative for rash and wound.  All other systems reviewed and are negative.     Allergies  Review of patient's allergies indicates no known allergies.  Home Medications   Prior to Admission medications   Medication Sig Start Date End Date Taking? Authorizing Provider  dicyclomine (BENTYL) 20 MG tablet Take 1 tablet (20 mg total) by mouth 2 (two) times daily. 08/02/14   Arman FilterGail K Musab Wingard, NP  ondansetron (ZOFRAN ODT) 4 MG disintegrating tablet Take 1 tablet (4 mg total) by mouth every 8 (eight) hours as needed for nausea or vomiting. 08/02/14   Arman FilterGail K Leotta Weingarten, NP  traMADol (ULTRAM) 50 MG tablet Take 1 tablet (50 mg total) by mouth every 6 (six) hours as needed. 07/16/14   Courtney A Forcucci, PA-C  vitamin E 200  UNIT capsule Take 200 Units by mouth daily.    Historical Provider, MD   BP 125/56 mmHg  Pulse 103  Temp(Src) 98.5 F (36.9 C) (Oral)  Resp 18  Ht 5\' 6"  (1.676 m)  Wt 120 lb (54.432 kg)  BMI 19.38 kg/m2  SpO2 98%  LMP 06/06/2014 Physical Exam  Constitutional: She appears well-developed and well-nourished.  HENT:  Head: Normocephalic.  Eyes: Pupils are equal, round, and reactive to light.  Neck: Normal range of motion.  Cardiovascular: Normal rate and regular rhythm.   Pulmonary/Chest:  Effort normal and breath sounds normal.  Abdominal: Soft. Bowel sounds are normal. She exhibits no distension and no mass. There is generalized tenderness. There is no guarding.  Musculoskeletal: Normal range of motion.  Neurological: She is alert.  Skin: Skin is warm and dry.  Nursing note and vitals reviewed.   ED Course  Procedures (including critical care time) Labs Review Labs Reviewed  URINALYSIS, ROUTINE W REFLEX MICROSCOPIC - Abnormal; Notable for the following:    APPearance CLOUDY (*)    Ketones, ur 15 (*)    Leukocytes, UA SMALL (*)    All other components within normal limits  CBC WITH DIFFERENTIAL - Abnormal; Notable for the following:    Hemoglobin 11.9 (*)    All other components within normal limits  URINE MICROSCOPIC-ADD ON - Abnormal; Notable for the following:    Squamous Epithelial / LPF FEW (*)    Bacteria, UA FEW (*)    All other components within normal limits  I-STAT CHEM 8, ED - Abnormal; Notable for the following:    Potassium 3.2 (*)    All other components within normal limits    Imaging Review No results found.   EKG Interpretation None     After IV fluids and IM Bentyl patient is feeling considerably better she'll be discharged home with Bentyl and referrals to GI as well as OB/GYN MDM   Final diagnoses:  Lower abdominal pain         Arman FilterGail K Arshdeep Bolger, NP 08/02/14 [id number]  Rolan BuccoMelanie Belfi, MD 08/02/14 [id number]
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
## 2014-08-02 NOTE — ED Notes (Signed)
Pt states she cannot give urine sample at this time.

## 2014-12-22 IMAGING — CR DG ABDOMEN 2V
2 series · 2 of 2 positions shown · non-contrast
Comparison: None.

CLINICAL DATA: Lower abdominal pain for 2 weeks

EXAM:
ABDOMEN - 2 VIEW

[w abdomen upright]
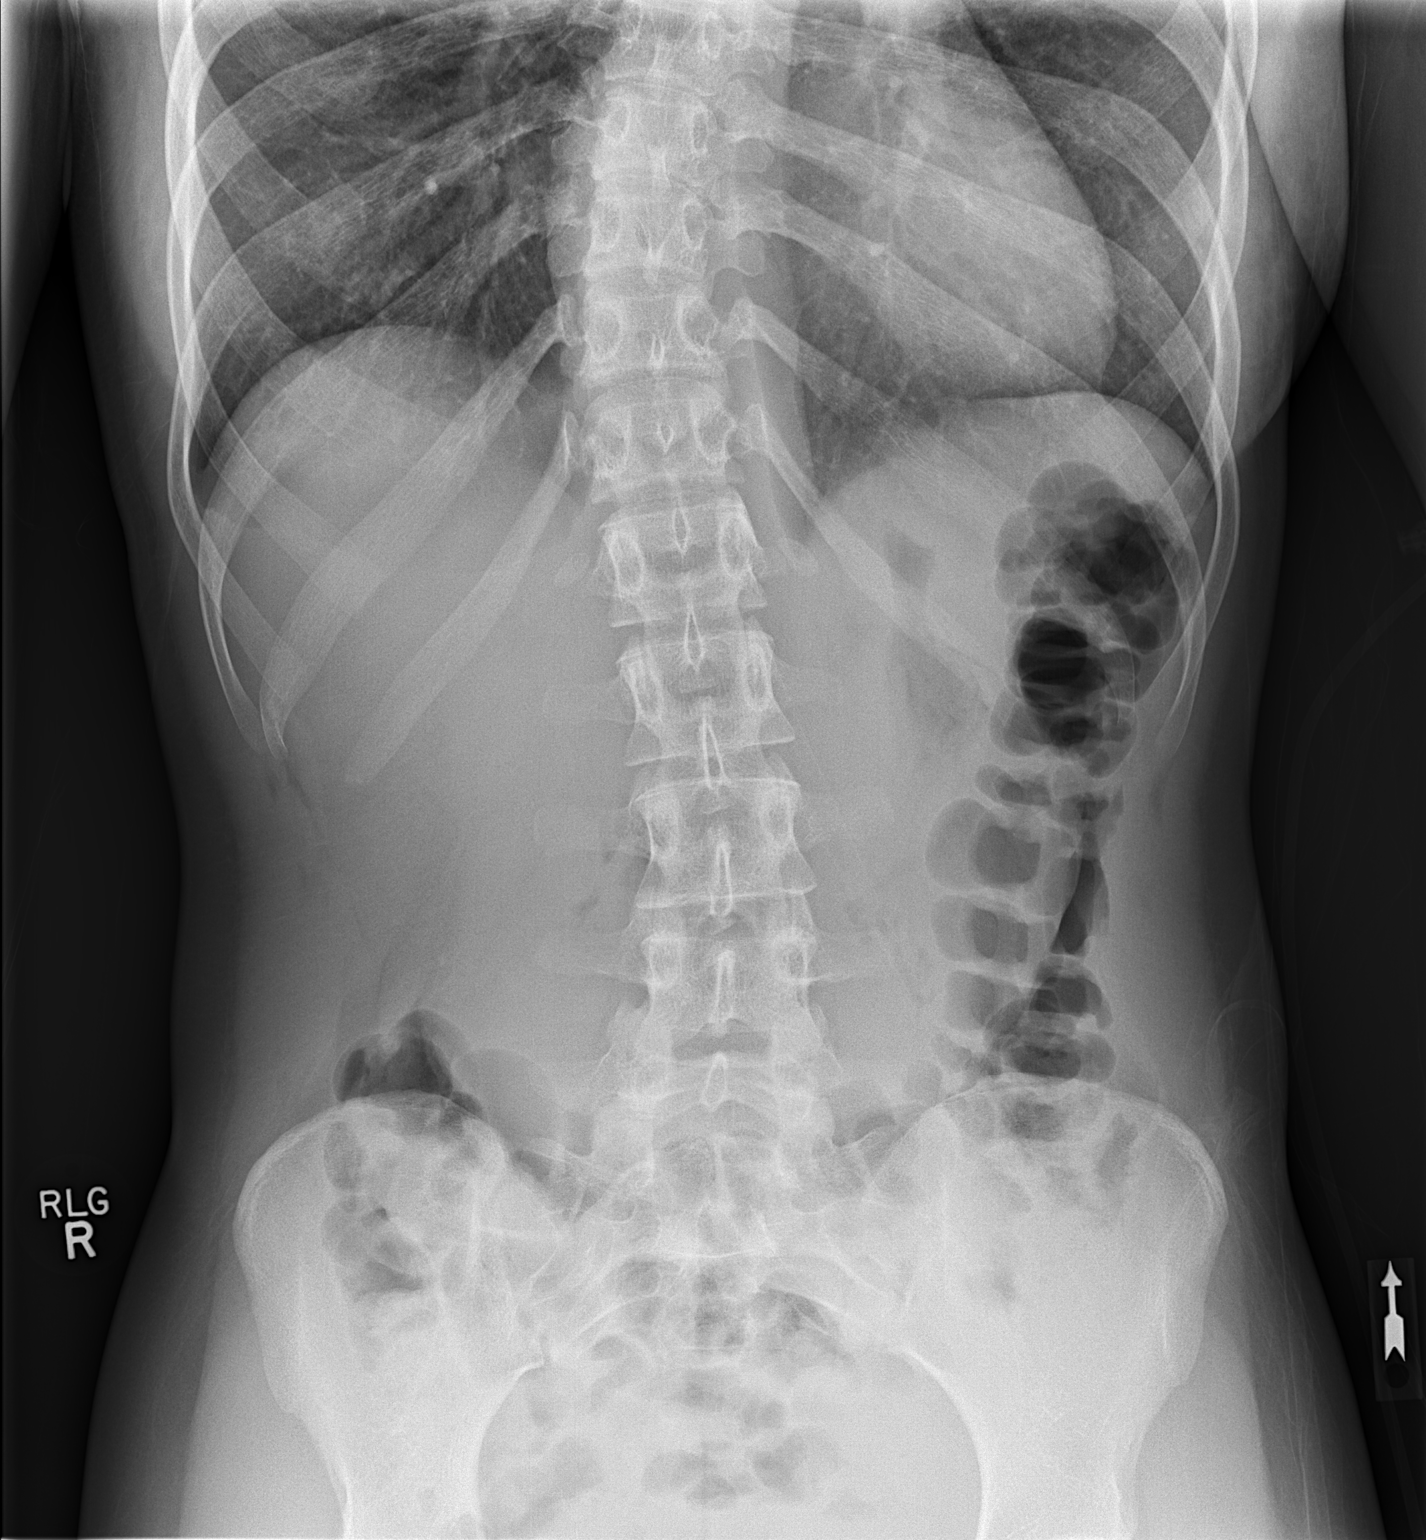

[t abdomen supine]
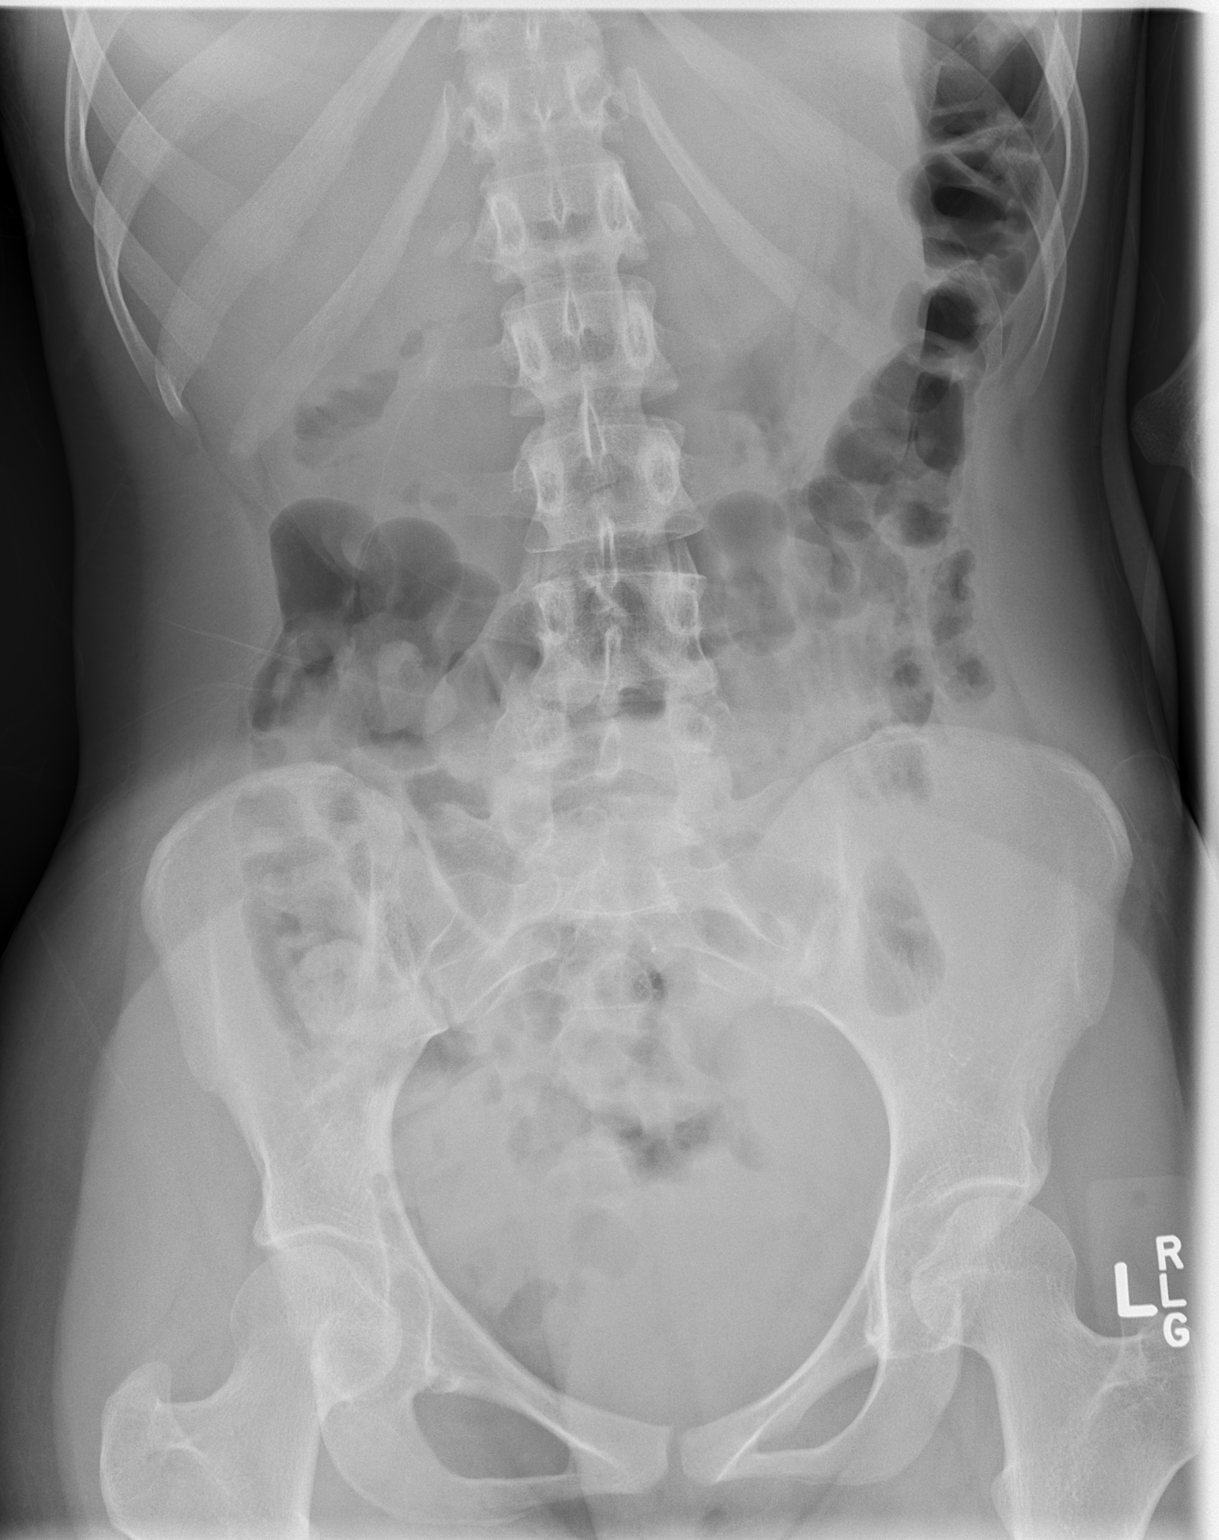

[2 of 2 positions shown; findings below may reference images not displayed]

FINDINGS: Scattered large and small bowel gas is noted. The stomach is
partially distended with fluid. No free air is seen. No abnormal
mass or abnormal calcifications are noted. No acute bony abnormality
is seen.
IMPRESSION: Nonspecific abdomen.
